# Patient Record
Sex: Male | Born: 1960 | Race: Black or African American | Hispanic: No | Marital: Married | Smoking: Former smoker
Health system: Southern US, Community
[De-identification: ages and names within clinical notes are randomized; demographics above are authoritative.]

## PROBLEM LIST (undated history)

## (undated) DIAGNOSIS — K219 Gastro-esophageal reflux disease without esophagitis: Secondary | ICD-10-CM

## (undated) DIAGNOSIS — M5416 Radiculopathy, lumbar region: Secondary | ICD-10-CM

## (undated) DIAGNOSIS — M549 Dorsalgia, unspecified: Secondary | ICD-10-CM

## (undated) DIAGNOSIS — H524 Presbyopia: Secondary | ICD-10-CM

## (undated) DIAGNOSIS — F329 Major depressive disorder, single episode, unspecified: Secondary | ICD-10-CM

## (undated) DIAGNOSIS — H5200 Hypermetropia, unspecified eye: Secondary | ICD-10-CM

## (undated) DIAGNOSIS — F419 Anxiety disorder, unspecified: Secondary | ICD-10-CM

## (undated) DIAGNOSIS — E119 Type 2 diabetes mellitus without complications: Secondary | ICD-10-CM

## (undated) DIAGNOSIS — I10 Essential (primary) hypertension: Secondary | ICD-10-CM

## (undated) DIAGNOSIS — E785 Hyperlipidemia, unspecified: Secondary | ICD-10-CM

## (undated) DIAGNOSIS — J309 Allergic rhinitis, unspecified: Secondary | ICD-10-CM

## (undated) DIAGNOSIS — M1990 Unspecified osteoarthritis, unspecified site: Secondary | ICD-10-CM

## (undated) DIAGNOSIS — F32A Depression, unspecified: Secondary | ICD-10-CM

## (undated) DIAGNOSIS — M5417 Radiculopathy, lumbosacral region: Secondary | ICD-10-CM

## (undated) DIAGNOSIS — Z8719 Personal history of other diseases of the digestive system: Secondary | ICD-10-CM

## (undated) HISTORY — PX: LUMBAR LAMINECTOMY: SHX95

## (undated) HISTORY — DX: Gastro-esophageal reflux disease without esophagitis: K21.9

## (undated) HISTORY — DX: Radiculopathy, lumbar region: M54.16

## (undated) HISTORY — DX: Essential (primary) hypertension: I10

## (undated) HISTORY — DX: Dorsalgia, unspecified: M54.9

## (undated) HISTORY — DX: Presbyopia: H52.4

## (undated) HISTORY — DX: Radiculopathy, lumbosacral region: M54.17

## (undated) HISTORY — DX: Allergic rhinitis, unspecified: J30.9

## (undated) HISTORY — PX: OTHER SURGICAL HISTORY: SHX169

## (undated) HISTORY — PX: BACK SURGERY: SHX140

## (undated) HISTORY — DX: Hypermetropia, unspecified eye: H52.00

## (undated) HISTORY — DX: Unspecified osteoarthritis, unspecified site: M19.90

## (undated) HISTORY — DX: Hyperlipidemia, unspecified: E78.5

---

## 1999-08-21 ENCOUNTER — Ambulatory Visit (HOSPITAL_COMMUNITY): Admission: RE | Admit: 1999-08-21 | Discharge: 1999-08-21 | Payer: Self-pay | Admitting: Cardiology

## 1999-08-21 ENCOUNTER — Encounter: Payer: Self-pay | Admitting: Cardiology

## 1999-09-03 ENCOUNTER — Ambulatory Visit (HOSPITAL_COMMUNITY): Admission: RE | Admit: 1999-09-03 | Discharge: 1999-09-03 | Payer: Self-pay | Admitting: Cardiology

## 2001-08-13 ENCOUNTER — Encounter: Payer: Self-pay | Admitting: Orthopedic Surgery

## 2001-08-13 ENCOUNTER — Encounter: Admission: RE | Admit: 2001-08-13 | Discharge: 2001-08-13 | Payer: Self-pay | Admitting: Orthopedic Surgery

## 2006-08-25 ENCOUNTER — Encounter (INDEPENDENT_AMBULATORY_CARE_PROVIDER_SITE_OTHER): Payer: Self-pay | Admitting: Specialist

## 2006-08-25 ENCOUNTER — Inpatient Hospital Stay (HOSPITAL_COMMUNITY): Admission: RE | Admit: 2006-08-25 | Discharge: 2006-08-27 | Payer: Self-pay | Admitting: Orthopedic Surgery

## 2006-08-25 HISTORY — PX: LAMINECTOMY: SHX219

## 2009-07-31 ENCOUNTER — Emergency Department (HOSPITAL_COMMUNITY): Admission: EM | Admit: 2009-07-31 | Discharge: 2009-07-31 | Payer: Self-pay | Admitting: Emergency Medicine

## 2011-01-23 LAB — COMPREHENSIVE METABOLIC PANEL
ALT: 18 U/L (ref 0–53)
BUN: 7 mg/dL (ref 6–23)
CO2: 28 mEq/L (ref 19–32)
Calcium: 9 mg/dL (ref 8.4–10.5)
GFR calc non Af Amer: >60 mL/min (ref >60)
Glucose, Bld: 100 mg/dL — ABNORMAL HIGH (ref 70–99)
Sodium: 139 mEq/L (ref 135–145)
Total Protein: 7.2 g/dL (ref 6.0–8.3)

## 2011-01-23 LAB — CBC
HCT: 43.7 % (ref 39.0–52.0)
Hemoglobin: 14.7 g/dL (ref 13.0–17.0)
MCHC: 33.7 g/dL (ref 30.0–36.0)
MCV: 88.6 fL (ref 78.0–100.0)
RBC: 4.93 MIL/uL (ref 4.22–5.81)
RDW: 13.1 % (ref 11.5–15.5)

## 2011-01-23 LAB — DIFFERENTIAL
Basophils Relative: 0 % (ref 0–1)
Eosinophils Absolute: 0.1 10*3/uL (ref 0.0–0.7)
Lymphs Abs: 2.6 10*3/uL (ref 0.7–4.0)
Neutro Abs: 3.4 10*3/uL (ref 1.7–7.7)
Neutrophils Relative %: 52 % (ref 43–77)

## 2011-01-23 LAB — POCT CARDIAC MARKERS
Myoglobin, poc: 84.8 ng/mL (ref 12–200)
Troponin i, poc: <0.05 ng/mL (ref 0.00–0.09)

## 2011-01-23 LAB — D-DIMER, QUANTITATIVE: D-Dimer, Quant: <0.22 ug/mL-FEU (ref 0.00–0.48)

## 2011-03-07 NOTE — H&P (Signed)
[name], [name] NO.:  [id number]   MEDICAL RECORD NO.:  [id number]          PATIENT TYPE:  INP   LOCATION:  NA                           FACILITY:  [facility]   PHYSICIAN:  Lianne Cure, P.A.  DATE OF BIRTH:  Nov 03, 1960   DATE OF ADMISSION:  DATE OF DISCHARGE:                                HISTORY & PHYSICAL   CHIEF COMPLAINT:  Lumbar pain with posterior left leg radiating pain.   HISTORY OF PRESENT ILLNESS:  Recurrence of herniated nucleus pulposus disk  L4-5 with radicular pain left lower extremity.   CURRENT MEDICATIONS:  1. Vicodin 7.5/750 twice daily.  2. Micardis 80 mg once daily.  3. Norvasc 10 mg once daily.  4. Paxil 10 mg once daily.  5. Prilosec 10 mg once daily.   PAST MEDICAL HISTORY:  1. Hypertension.  2. Lumbar herniated nucleus pulposus.   PAST SURGICAL HISTORY:  L4-5 disk diskectomy surgery in 1997.   FAMILY HISTORY:  CVA, hypertension, diabetes, OA, cancer.   REVIEW OF SYSTEMS:  He reports no fevers, chills.  No sudden weight loss. No  hoarseness.  No bleeding tendencies.  No shortness of breath.  No bowel or  bladder incontinence.   PHYSICAL EXAM:  VITAL SIGNS:  He has a temperature of 97.5, pulse of 91,  respirations of 15, and blood pressure 132/92.  GENERAL:  He appears well-developed, well-nourished.  Normocephalic.  HEENT:  Pupils are equal, round, and reactive to light.  NECK:  Supple.  No adenopathy.  No masses palpated.  Full range of motion.  CHEST:  Clear to auscultation bilaterally.  No wheezes.  HEART:  Regular rate and rhythm.  No murmurs.  ABDOMEN:  Soft, positive bowel sounds auscultated.  Nontender to palpation.  EXTREMITIES:  He reports left posterior leg radicular pain with straight leg  raise and on and off throughout the day, depending on positioning.  He is  currently in a seated position with the majority of his weight resting on  the right buttock area.  SKIN:  Clean, dry, and intact.   MRI shows  degenerative disk at L5, S1 and recurrent herniation at L4-5.   PLAN:  Laminotomy disk effusion L4-5 by Dr. Nelda Stewart.      Lianne Cure, P.A.     MC/MEDQ  D:  08/21/2006  T:  08/22/2006  Job:  [id number]

## 2011-03-07 NOTE — Op Note (Signed)
NAMECORRIN, HINGLE              ACCOUNT NO.:  [id number]   MEDICAL RECORD NO.:  [id number]          PATIENT TYPE:  AMB   LOCATION:  SDS                          FACILITY:  [facility]   PHYSICIAN:  Nelda Severe, MD      DATE OF BIRTH:  [birth year]   DATE OF PROCEDURE:  08/25/2006  DATE OF DISCHARGE:                                 OPERATIVE REPORT   SURGEON:  Dr. Nelda Severe.   ASSISTANT:  Lianne Cure, PA-C   PREOPERATIVE DIAGNOSIS:  Recurrent L4-5 disk herniation.   POSTOPERATIVE DIAGNOSIS:  Recurrent L4-5 disk herniation.   OPERATIVE PROCEDURE:  L4-5 re-exploration, laminectomy and disk excision,  left side.   INDICATIONS FOR SURGERY:  Severe disabling left sciatic pain.   OPERATIVE NOTE:  The patient was brought to the operating room and placed  under general endotracheal anesthesia.  Previously been given a gram of  vancomycin intravenously.  Bilateral sequential compression devices were  placed on both lower extremities.  Foley catheter was placed in the bladder.   He is positioned prone on the [place] frame.  Care was taken to position him  so as to avoid hyperflexion abduction of his left shoulder and hyperflexion  the left elbow.  The upper extremity was padded from axilla to hand with  foam.  Because of a mechanical failure of the arm support on the patient's  right side at the time that we had positioned him, his right arm was padded  well and placed at his side and the arm held in position with tape placed  over the foam padding.   The lumbar area was clipped of hair.  The previous midline incision was  outlined skin marker.  The lumbar area was prepped with DuraPrep and draped  in square fashion.  The drapes were secured with Ioban.   The skin was scored in elliptical fashion so as to excise the previous  hypertrophic scar.  The subcutaneous tissue and dermis were infiltrated with  quarter percent Marcaine with epinephrine 1% Marcaine with epinephrine mixed  1:1.  The elliptical incision was then completed into the subcutaneous layer  and the previous scar so excised.   Dissection was carried down to the spinous processes of the distal lumbar  spine.  Paraspinal muscles were reflected to the left side.  Cross-table  lateral radiograph was taken with a Kocher on what proved to be L3.   We next then exposed what we thought to be the L4-5 interspace and performed  a laminotomy.  However, there this area appeared virginal, no scar and the  disk was not herniated.  Cross-table lateral radiograph was taken with a  Penfield 4 in the disk space which I used to perforate the annulus.  This  proved to be L3-4 disk.   Therefore, we moved one space distally.  The inferior facette, left L5 was  extremely overgrown.  The medial facetectomy was performed with an  osteotome.  The laminectomy was extended proximally with osteotomes and  Kerrison rongeurs.  Ligamentum flavum was detached using curettes and  removed from proximal to distal.  It  was possible to see the origin of the  L5 nerve root, prior to their disappearing into scar.  We retracted we could  see the L5 nerve root medially and then incised the L4-5 disk laterally so  that it would Epstein curette and then pituitary rongeurs.  Ultimately we  placed a large lamina spreader between the spinous process of L4 and L5 so  as to open up the disk space better.  What followed was fairly arduous task  of curetting out the disk little by little mobilizing the nerve root away  from the scar.  Ultimately a thorough diskectomy was performed and several  moderately large fragments of sequestrated disk fragments were removed,  mostly distally and somewhat centrally.  At the completion of this  diskectomy the L5 nerve root was quite mobile and appeared to be not under  any significant pressure or tension.   The disk space was repeatedly irrigated and small fragments of additional  fragments sucked out.  There  was some mild oozing.  A Blake drain was placed  in the depths of the wound in the laminectomy defect and brought out through  the skin to the left side.  The Blake drain was secured with the basket  weave type of 2-0 nylon suture.  The fascia was then closed using  interrupted figure-of-eight #1 Vicryl sutures.  The subcutaneous tissue was  closed using interrupted 2-0 Vicryl sutures and the skin was closed using  undyed 3-0 Vicryl in subcuticular fashion.  The skin edges were reinforced  with Steri-Strips.  A nonadherent antibiotic ointment dressing was applied  and secured with OpSite.   The blood loss estimated at 150 mL.  There were no intraoperative  complications other than misidentification of the level which was corrected  and the correct level documented with a cross-table lateral radiograph.  Sponge, needle counts were correct.   The patient has not awakened sufficiently perform neurologic examination at  this time so none is reported in this note.      Nelda Severe, MD  Electronically Signed     MT/MEDQ  D:  08/25/2006  T:  08/26/2006  Job:  [id number]

## 2011-03-07 NOTE — Discharge Summary (Signed)
NAMEKYM, FENTER NO.:  [id number]   MEDICAL RECORD NO.:  [id number]          PATIENT TYPE:  INP   LOCATION:  5012                         FACILITY:  [facility]   PHYSICIAN:  Nelda Severe, MD      DATE OF BIRTH:  08-28-1961   DATE OF ADMISSION:  08/25/2006  DATE OF DISCHARGE:  08/27/2006                                 DISCHARGE SUMMARY   ADMITTING DIAGNOSES:  1. Lumbar herniated nucleus pulposus at L4-5.  2. Hypertension.   DISCHARGE DIAGNOSES:  1. Status post discectomy, laminotomy L4-5.  2. Hypertension.   BRIEF HISTORY OF ADMISSION COURSE:  Patient was taken to the OR by Dr.  Nelda Severe on August 25, 2006 for discectomy at L4-5, laminectomy, disc  excision.  Patient was stable in recovery postoperatively.  He was placed on  his home medications and he was given Norco 10/325 one to two q.6. p.r.n.  for pain control.  He was placed in SCDs and knee-high TED hose.  Physical  therapy was ordered.  Drain was then placed, Hemovac.  He was given morphine  one q.2-4.h. p.r.n. for breakthrough pain IV until he could take POs, as  well Vancomycin was administered x1 dose preoperatively and x2 doses  postoperatively, dose per pharmacy.  Postop day one, the patient had  ambulated with physical therapy, had significant muscle spasms so was kept  for observation.  Postop day two, patient tolerating POs well, ambulating  independently with rolling walker.  The Hemovac was discharged on day one,  very minimal drainage from the Hemovac site.  The incision site itself was  clean, dry and intact.  Distally, he is neurovascular and motor intact.  His  left sciatic pain has diminished significantly postoperatively.  Diet has  been advanced to a regular diet as tolerated.   ASSESSMENT:  Status post discectomy, laminotomy L4-5.   PLAN:  We are going to discharge him home in the care of his family.  He has  a walker.  He is going to walk for exercise.  He may shower on day  three.  He is going to follow up with Dr. Nelda Severe in the office four weeks  from now, he will call for an appointment.  He has Flexeril at home to take  p.r.n. for muscle spasms, 10 mg q.6. and once drainage has stopped, no  dressing is necessary, no brace is necessary.  His condition is stable.  Diet is regular.      Lianne Cure, P.A.      Nelda Severe, MD  Electronically Signed    MC/MEDQ  D:  08/27/2006  T:  08/27/2006  Job:  [id number]

## 2011-10-22 ENCOUNTER — Other Ambulatory Visit: Payer: Self-pay | Admitting: Cardiology

## 2011-11-12 HISTORY — PX: HEMORROIDECTOMY: SUR656

## 2013-05-02 DIAGNOSIS — M5417 Radiculopathy, lumbosacral region: Secondary | ICD-10-CM

## 2013-05-02 HISTORY — DX: Radiculopathy, lumbosacral region: M54.17

## 2014-08-04 DIAGNOSIS — M549 Dorsalgia, unspecified: Secondary | ICD-10-CM

## 2014-08-04 HISTORY — DX: Dorsalgia, unspecified: M54.9

## 2015-03-12 DIAGNOSIS — M5416 Radiculopathy, lumbar region: Secondary | ICD-10-CM

## 2015-03-12 HISTORY — DX: Radiculopathy, lumbar region: M54.16

## 2015-06-28 ENCOUNTER — Other Ambulatory Visit: Payer: Self-pay | Admitting: Orthopedic Surgery

## 2015-06-29 ENCOUNTER — Encounter: Payer: Self-pay | Admitting: Vascular Surgery

## 2015-07-02 ENCOUNTER — Encounter: Payer: Self-pay | Admitting: Vascular Surgery

## 2015-07-16 ENCOUNTER — Encounter: Payer: Self-pay | Admitting: Vascular Surgery

## 2015-07-17 ENCOUNTER — Ambulatory Visit (INDEPENDENT_AMBULATORY_CARE_PROVIDER_SITE_OTHER): Payer: Commercial Managed Care - HMO | Admitting: Vascular Surgery

## 2015-07-17 ENCOUNTER — Encounter: Payer: Self-pay | Admitting: Vascular Surgery

## 2015-07-17 VITALS — BP 114/71 | HR 76 | Temp 97.2°F | Resp 18 | Ht 70.0 in | Wt 256.7 lb

## 2015-07-17 DIAGNOSIS — M5137 Other intervertebral disc degeneration, lumbosacral region: Secondary | ICD-10-CM | POA: Diagnosis not present

## 2015-07-17 NOTE — Progress Notes (Signed)
Patient name: [name] MRN: [id number] DOB: 02/19/1961 Sex: male   Referred by: Yevette Edwards  Reason for referral:  Chief Complaint  Patient presents with  . New Evaluation    ALIF scheduled for 08-01-2015    HISTORY OF PRESENT ILLNESS:  patient resents today for discussion of anterior exposure for L4-5 and L5-S1 surgery. His long history of degenerative disc disease and has had for several prior lumbar back surgeries. He has no history of prior intra-abdominal surgery. Has no history of cardiac disease and no history of peripheral vascular occlusive disease  Past Medical History  Diagnosis Date  . Hypertension   . Hyperlipidemia   . Osteoarthritis   . Gastroesophageal reflux disease   . DJD (degenerative joint disease)   . Lumbar radiculopathy   . Hypermetropia   . Presbyopia   . Allergic rhinitis   . Lumbar radiculopathy 03-12-2015  . Back pain with radiation 08-04-2014  . Lumbosacral neuritis 05-02-2013    Past Surgical History  Procedure Laterality Date  . Hemorroidectomy  11/12/2011  . Epidural injections    . Lumbar laminectomy    . Laminectomy  08/25/2006    Social History   Social History  . Marital Status: Single    Spouse Name: N/A  . Number of Children: N/A  . Years of Education: N/A   Occupational History  . Not on file.   Social History Main Topics  . Smoking status: Former Smoker    Types: Cigarettes    Quit date: 03/20/2009  . Smokeless tobacco: Not on file  . Alcohol Use: 1.2 - 1.8 oz/week    0 Standard drinks or equivalent, 2-3 Glasses of wine per week  . Drug Use: No  . Sexual Activity: Not on file   Other Topics Concern  . Not on file   Social History Narrative    Family History  Problem Relation Age of Onset  . Hypertension Mother   . Hypertension Father     Allergies as of 07/17/2015  . (No Known Allergies)    Current Outpatient Prescriptions on File Prior to Visit  Medication Sig Dispense Refill  .  dexlansoprazole (DEXILANT) 60 MG capsule Take 60 mg by mouth daily.    Marland Kitchen LISINOPRIL PO Take 20 mg by mouth daily.    . Olmesartan-Amlodipine-HCTZ (TRIBENZOR) 40-10-25 MG TABS Take by mouth.    Marland Kitchen PARoxetine (PAXIL) 10 MG tablet Take 10 mg by mouth daily.    Marland Kitchen SPIRONOLACTONE PO Take 25 mg by mouth.    . TRAMADOL HCL PO Take 50 mg by mouth.    . Vardenafil HCl (LEVITRA PO) Take 20 mg by mouth.     No current facility-administered medications on file prior to visit.     REVIEW OF SYSTEMS:  Positives indicated with an "X"  CARDIOVASCULAR:   chest pain    chest pressure    palpitations    orthopnea    dyspnea on exertion    claudication    rest pain    DVT    phlebitis PULMONARY:    productive cough    asthma    wheezing NEUROLOGIC:   [ x] weakness  [x ] paresthesias   aphasia   amaurosis   dizziness HEMATOLOGIC:    bleeding problems    clotting disorders MUSCULOSKELETAL:   joint pain    joint swelling GASTROINTESTINAL:   blood in stool    hematemesis GENITOURINARY:    dysuria    hematuria PSYCHIATRIC:   history of major depression INTEGUMENTARY:   rashes   ulcers CONSTITUTIONAL:   fever    chills  PHYSICAL EXAMINATION:  General: The patient is a well-nourished male, in no acute distress. Vital signs are BP 114/71 mmHg  Pulse 76  Temp(Src) 97.2 F (36.2 C) (Oral)  Resp 18  Ht  (1.778 m)  Wt 256 lb 11.2 oz (116.438 kg)  BMI 36.83 kg/m2  SpO2 96% Pulmonary: There is a good air exchange  Abdomen: Soft and non-tender  With no palpable masses palpable. Moderate obesity Musculoskeletal: There are no major deformities.  There is no significant extremity pain. Neurologic: No focal weakness or paresthesias are detected, Skin: There are no ulcer or rashes noted. Psychiatric: The patient has normal affect. Cardiovascular:  Palpable dorsalis pedis pulses bilatrally    Impression and Plan:   I  discussed my role for exposure. Explain the incision related to 2 level exposure which is a left paramedian. Explain mobilization of intraperitoneal contents, left ureter, iliac arteries and veins. Discussed potential injury of all these. Does not have any issues that would the indicating increased risk for plannd procedure. He understands and wished to proceed as scheduled on 08/01/2015    Gretta Began [facility] [place] [facility] Office: [phone]

## 2015-07-19 ENCOUNTER — Other Ambulatory Visit: Payer: Self-pay

## 2015-07-24 ENCOUNTER — Other Ambulatory Visit (HOSPITAL_COMMUNITY): Payer: Self-pay | Admitting: *Deleted

## 2015-07-24 ENCOUNTER — Encounter (HOSPITAL_COMMUNITY): Payer: Self-pay

## 2015-07-24 ENCOUNTER — Encounter (HOSPITAL_COMMUNITY)
Admission: RE | Admit: 2015-07-24 | Discharge: 2015-07-24 | Disposition: A | Discharge status: Home / Self Care | Payer: Commercial Managed Care - HMO | Source: Ambulatory Visit | Attending: Orthopedic Surgery | Admitting: Orthopedic Surgery

## 2015-07-24 ENCOUNTER — Ambulatory Visit (HOSPITAL_COMMUNITY)
Admission: RE | Admit: 2015-07-24 | Discharge: 2015-07-24 | Disposition: A | Discharge status: Home / Self Care | Payer: Commercial Managed Care - HMO | Source: Ambulatory Visit | Attending: Orthopedic Surgery | Admitting: Orthopedic Surgery

## 2015-07-24 DIAGNOSIS — Z01812 Encounter for preprocedural laboratory examination: Secondary | ICD-10-CM | POA: Diagnosis not present

## 2015-07-24 DIAGNOSIS — Z01818 Encounter for other preprocedural examination: Secondary | ICD-10-CM | POA: Diagnosis not present

## 2015-07-24 DIAGNOSIS — Z01810 Encounter for preprocedural cardiovascular examination: Secondary | ICD-10-CM | POA: Insufficient documentation

## 2015-07-24 DIAGNOSIS — I10 Essential (primary) hypertension: Secondary | ICD-10-CM | POA: Diagnosis not present

## 2015-07-24 DIAGNOSIS — Z87891 Personal history of nicotine dependence: Secondary | ICD-10-CM | POA: Insufficient documentation

## 2015-07-24 HISTORY — DX: Depression, unspecified: F32.A

## 2015-07-24 HISTORY — DX: Personal history of other diseases of the digestive system: Z87.19

## 2015-07-24 HISTORY — DX: Anxiety disorder, unspecified: F41.9

## 2015-07-24 HISTORY — DX: Major depressive disorder, single episode, unspecified: F32.9

## 2015-07-24 LAB — SURGICAL PCR SCREEN
MRSA, PCR: NEGATIVE
STAPHYLOCOCCUS AUREUS: NEGATIVE

## 2015-07-24 LAB — CBC WITH DIFFERENTIAL/PLATELET
BASOS ABS: 0.1 10*3/uL (ref 0.0–0.1)
Basophils Relative: 1 %
EOS PCT: 1 %
Eosinophils Absolute: 0.1 10*3/uL (ref 0.0–0.7)
HCT: 41.7 % (ref 39.0–52.0)
HEMOGLOBIN: 13.9 g/dL (ref 13.0–17.0)
LYMPHS ABS: 2.5 10*3/uL (ref 0.7–4.0)
LYMPHS PCT: 31 %
MCH: 28.7 pg (ref 26.0–34.0)
MCHC: 33.3 g/dL (ref 30.0–36.0)
MCV: 86 fL (ref 78.0–100.0)
Monocytes Absolute: 0.3 10*3/uL (ref 0.1–1.0)
Monocytes Relative: 4 %
NEUTROS ABS: 5 10*3/uL (ref 1.7–7.7)
NEUTROS PCT: 63 %
Platelets: 207 10*3/uL (ref 150–400)
RBC: 4.85 MIL/uL (ref 4.22–5.81)
RDW: 13.2 % (ref 11.5–15.5)
WBC: 8 10*3/uL (ref 4.0–10.5)

## 2015-07-24 LAB — URINALYSIS, ROUTINE W REFLEX MICROSCOPIC
Bilirubin Urine: NEGATIVE
Glucose, UA: NEGATIVE mg/dL
Hgb urine dipstick: NEGATIVE
KETONES UR: NEGATIVE mg/dL
LEUKOCYTES UA: NEGATIVE
NITRITE: NEGATIVE
PROTEIN: NEGATIVE mg/dL
Specific Gravity, Urine: 1.017 (ref 1.005–1.030)
Urobilinogen, UA: 0.2 mg/dL (ref 0.0–1.0)
pH: 5 (ref 5.0–8.0)

## 2015-07-24 LAB — COMPREHENSIVE METABOLIC PANEL
ALK PHOS: 65 U/L (ref 38–126)
ALT: 23 U/L (ref 17–63)
AST: 30 U/L (ref 15–41)
Albumin: 4 g/dL (ref 3.5–5.0)
Anion gap: 11 (ref 5–15)
BUN: 16 mg/dL (ref 6–20)
CALCIUM: 9.5 mg/dL (ref 8.9–10.3)
CHLORIDE: 101 mmol/L (ref 101–111)
CO2: 26 mmol/L (ref 22–32)
CREATININE: 1.36 mg/dL — AB (ref 0.61–1.24)
GFR, EST NON AFRICAN AMERICAN: 58 mL/min — AB (ref >60)
Glucose, Bld: 148 mg/dL — ABNORMAL HIGH (ref 65–99)
Potassium: 4 mmol/L (ref 3.5–5.1)
Sodium: 138 mmol/L (ref 135–145)
Total Bilirubin: 0.6 mg/dL (ref 0.3–1.2)
Total Protein: 7.3 g/dL (ref 6.5–8.1)

## 2015-07-24 LAB — TYPE AND SCREEN
ABO/RH(D): B POS
Antibody Screen: NEGATIVE

## 2015-07-24 LAB — PROTIME-INR
INR: 1.19 (ref 0.00–1.49)
PROTHROMBIN TIME: 15.2 s (ref 11.6–15.2)

## 2015-07-24 LAB — APTT: APTT: 27 s (ref 24–37)

## 2015-07-24 NOTE — Progress Notes (Signed)
   07/24/15 1047  OBSTRUCTIVE SLEEP APNEA  Have you ever been diagnosed with sleep apnea through a sleep study? No  Do you snore loudly (loud enough to be heard through closed doors)?  0  Do you often feel tired, fatigued, or sleepy during the daytime (such as falling asleep during driving or talking to someone)? 1  Has anyone observed you stop breathing during your sleep? 0  Do you have, or are you being treated for high blood pressure? 1  BMI more than 35 kg/m2? 1  Age > 50 (1-yes) 1  Neck circumference greater than:Male 16 inches or larger, Male 17inches or larger? 1  Male Gender (Yes=1) 1  Obstructive Sleep Apnea Score 6  Score 5 or greater  Results sent to PCP

## 2015-07-24 NOTE — Pre-Procedure Instructions (Signed)
KIER SMEAD  07/24/2015      [id number] [place], [place] - [address] [place] [address] Phone: [phone] Fax: [phone]    Your procedure is scheduled on 08/01/2015  Report to [facility] Admitting at 6:30A.M.  Call this number if you have problems the morning of surgery:  [phone]   Remember:  Do not eat food or drink liquids after midnight.  On TUESDAY  Take these medicines the morning of surgery with A SIP OF WATER : take Paxil, Metoprolol, Dexilant   Do not wear jewelry  Do not wear lotions, powders, or perfumes.  You may wear deodorant.             Men may shave face and neck.  Do not bring valuables to the hospital.  [facility] is not responsible for any belongings or valuables.  Contacts, dentures or bridgework may not be worn into surgery.  Leave your suitcase in the car.  After surgery it may be brought to your room.  For patients admitted to the hospital, discharge time will be determined by your treatment team.  Patients discharged the day of surgery will not be allowed to drive home.   Name and phone number of your driver:   /with family Special instructions:  Special Instructions: [place] - Preparing for Surgery  Before surgery, you can play an important role.  Because skin is not sterile, your skin needs to be as free of germs as possible.  You can reduce the number of germs on you skin by washing with CHG (chlorahexidine gluconate) soap before surgery.  CHG is an antiseptic cleaner which kills germs and bonds with the skin to continue killing germs even after washing.  Please DO NOT use if you have an allergy to CHG or antibacterial soaps.  If your skin becomes reddened/irritated stop using the CHG and inform your nurse when you arrive at Short Stay.  Do not shave (including legs and underarms) for at least 48 hours prior to the first CHG shower.  You may shave your  face.  Please follow these instructions carefully:   1.  Shower with CHG Soap the night before surgery and the  morning of Surgery.  2.  If you choose to wash your hair, wash your hair first as usual with your  normal shampoo.  3.  After you shampoo, rinse your hair and body thoroughly to remove the  Shampoo.  4.  Use CHG as you would any other liquid soap.  You can apply chg directly to the skin and wash gently with scrungie or a clean washcloth.  5.  Apply the CHG Soap to your body ONLY FROM THE NECK DOWN.    Do not use on open wounds or open sores.  Avoid contact with your eyes, ears, mouth and genitals (private parts).  Wash genitals (private parts)   with your normal soap.  6.  Wash thoroughly, paying special attention to the area where your surgery will be performed.  7.  Thoroughly rinse your body with warm water from the neck down.  8.  DO NOT shower/wash with your normal soap after using and rinsing off   the CHG Soap.  9.  Pat yourself dry with a clean towel.            10.  Wear clean pajamas.            11.  Place clean  sheets on your bed the night of your first shower and do not sleep with pets.  Day of Surgery  Do not apply any lotions/deodorants the morning of surgery.  Please wear clean clothes to the hospital/surgery center.  Please read over the following fact sheets that you were given. Pain Booklet, Coughing and Deep Breathing, Blood Transfusion Information, MRSA Information and Surgical Site Infection Prevention

## 2015-07-24 NOTE — Progress Notes (Signed)
Via fax, requested note from Dr. Sharyn Lull & last ekg.

## 2015-07-31 MED ORDER — POVIDONE-IODINE 7.5 % EX SOLN
Freq: Once | CUTANEOUS | Status: DC
  Filled 2015-07-31: qty 118

## 2015-07-31 MED ORDER — CEFAZOLIN SODIUM-DEXTROSE 2-3 GM-% IV SOLR
2.0000 g | INTRAVENOUS | Status: AC
  Administered 2015-08-01 (×3): 2 g via INTRAVENOUS
  Filled 2015-07-31: qty 50

## 2015-07-31 NOTE — Anesthesia Preprocedure Evaluation (Addendum)
Anesthesia Evaluation  Patient identified by MRN, date of birth, ID band Patient awake    Reviewed: Allergy & Precautions, NPO status , Patient's Chart, lab work & pertinent test results  History of Anesthesia Complications Negative for: history of anesthetic complications  Airway Mallampati: II  TM Distance: >3 FB Neck ROM: Full    Dental  (+) Teeth Intact, Dental Advisory Given   Pulmonary former smoker,    Pulmonary exam normal        Cardiovascular hypertension, Pt. on medications and Pt. on home beta blockers Normal cardiovascular exam     Neuro/Psych PSYCHIATRIC DISORDERS Anxiety Depression    GI/Hepatic Neg liver ROS, hiatal hernia, GERD  Medicated and Controlled,  Endo/Other  Morbid obesity  Renal/GU negative Renal ROS     Musculoskeletal  (+) Arthritis , Osteoarthritis,    Abdominal   Peds  Hematology   Anesthesia Other Findings   Reproductive/Obstetrics                          Anesthesia Physical Anesthesia Plan  ASA: III  Anesthesia Plan: General   Post-op Pain Management:    Induction: Intravenous  Airway Management Planned: Oral ETT  Additional Equipment:   Intra-op Plan:   Post-operative Plan: Extubation in OR  Informed Consent: I have reviewed the patients History and Physical, chart, labs and discussed the procedure including the risks, benefits and alternatives for the proposed anesthesia with the patient or authorized representative who has indicated his/her understanding and acceptance.   Dental advisory given  Plan Discussed with: CRNA, Anesthesiologist and Surgeon  Anesthesia Plan Comments:       Anesthesia Quick Evaluation

## 2015-08-01 ENCOUNTER — Inpatient Hospital Stay (HOSPITAL_COMMUNITY): Payer: Commercial Managed Care - HMO | Admitting: Anesthesiology

## 2015-08-01 ENCOUNTER — Inpatient Hospital Stay (HOSPITAL_COMMUNITY)
Admission: RE | Admit: 2015-08-01 | Discharge: 2015-08-04 | DRG: 455 | Disposition: A | Discharge status: Home Health | Payer: Commercial Managed Care - HMO | Source: Ambulatory Visit | Attending: Orthopedic Surgery | Admitting: Orthopedic Surgery

## 2015-08-01 ENCOUNTER — Inpatient Hospital Stay (HOSPITAL_COMMUNITY): Payer: Commercial Managed Care - HMO

## 2015-08-01 ENCOUNTER — Encounter (HOSPITAL_COMMUNITY): Payer: Self-pay | Admitting: *Deleted

## 2015-08-01 ENCOUNTER — Encounter (HOSPITAL_COMMUNITY)
Admission: RE | Disposition: A | Discharge status: Home Health | Payer: Self-pay | Source: Ambulatory Visit | Attending: Orthopedic Surgery

## 2015-08-01 DIAGNOSIS — E785 Hyperlipidemia, unspecified: Secondary | ICD-10-CM | POA: Diagnosis present

## 2015-08-01 DIAGNOSIS — M5410 Radiculopathy, site unspecified: Secondary | ICD-10-CM | POA: Diagnosis present

## 2015-08-01 DIAGNOSIS — K219 Gastro-esophageal reflux disease without esophagitis: Secondary | ICD-10-CM | POA: Diagnosis present

## 2015-08-01 DIAGNOSIS — M5136 Other intervertebral disc degeneration, lumbar region: Secondary | ICD-10-CM

## 2015-08-01 DIAGNOSIS — Z87891 Personal history of nicotine dependence: Secondary | ICD-10-CM

## 2015-08-01 DIAGNOSIS — M4806 Spinal stenosis, lumbar region: Secondary | ICD-10-CM | POA: Diagnosis present

## 2015-08-01 DIAGNOSIS — F329 Major depressive disorder, single episode, unspecified: Secondary | ICD-10-CM | POA: Diagnosis present

## 2015-08-01 DIAGNOSIS — M5137 Other intervertebral disc degeneration, lumbosacral region: Principal | ICD-10-CM | POA: Diagnosis present

## 2015-08-01 DIAGNOSIS — Z419 Encounter for procedure for purposes other than remedying health state, unspecified: Secondary | ICD-10-CM

## 2015-08-01 DIAGNOSIS — I10 Essential (primary) hypertension: Secondary | ICD-10-CM | POA: Diagnosis present

## 2015-08-01 HISTORY — PX: ANTERIOR LUMBAR FUSION: SHX1170

## 2015-08-01 HISTORY — PX: ABDOMINAL EXPOSURE: SHX5708

## 2015-08-01 SURGERY — ANTERIOR LUMBAR FUSION 2 LEVELS
Anesthesia: General | Site: Spine Lumbar

## 2015-08-01 MED ORDER — CALCIUM CHLORIDE 10 % IV SOLN
INTRAVENOUS | Status: DC | PRN
  Administered 2015-08-01: 100 mg via INTRAVENOUS

## 2015-08-01 MED ORDER — AMLODIPINE BESYLATE 5 MG PO TABS
5.0000 mg | ORAL_TABLET | Freq: Every day | ORAL | Status: DC
  Administered 2015-08-02 – 2015-08-03 (×2): 5 mg via ORAL
  Filled 2015-08-01 (×3): qty 1

## 2015-08-01 MED ORDER — PROPOFOL 10 MG/ML IV BOLUS
INTRAVENOUS | Status: AC
  Filled 2015-08-01: qty 20

## 2015-08-01 MED ORDER — BUPIVACAINE-EPINEPHRINE 0.25% -1:200000 IJ SOLN
INTRAMUSCULAR | Status: DC | PRN
  Administered 2015-08-01: 17 mL

## 2015-08-01 MED ORDER — SPIRONOLACTONE 25 MG PO TABS
25.0000 mg | ORAL_TABLET | Freq: Every day | ORAL | Status: DC
  Administered 2015-08-02 – 2015-08-04 (×3): 25 mg via ORAL
  Filled 2015-08-01 (×3): qty 1

## 2015-08-01 MED ORDER — ACETAMINOPHEN 650 MG RE SUPP: 650.0000 mg | RECTAL | Status: DC | PRN

## 2015-08-01 MED ORDER — LIDOCAINE HCL (CARDIAC) 20 MG/ML IV SOLN
INTRAVENOUS | Status: AC
  Filled 2015-08-01: qty 5

## 2015-08-01 MED ORDER — BUPIVACAINE-EPINEPHRINE 0.25% -1:200000 IJ SOLN
INTRAMUSCULAR | Status: DC | PRN
  Administered 2015-08-01: 10 mL

## 2015-08-01 MED ORDER — FENTANYL CITRATE (PF) 250 MCG/5ML IJ SOLN
INTRAMUSCULAR | Status: AC
  Filled 2015-08-01: qty 5

## 2015-08-01 MED ORDER — METHYLENE BLUE 1 % INJ SOLN
1.0000 mg | INTRAMUSCULAR | Status: DC | PRN
  Administered 2015-08-01: 1 mg via INTRAVENOUS

## 2015-08-01 MED ORDER — SODIUM CHLORIDE 0.9 % IJ SOLN
INTRAMUSCULAR | Status: AC
  Filled 2015-08-01: qty 10

## 2015-08-01 MED ORDER — EPHEDRINE SULFATE 50 MG/ML IJ SOLN
INTRAMUSCULAR | Status: AC
  Filled 2015-08-01: qty 1

## 2015-08-01 MED ORDER — PAROXETINE HCL 20 MG PO TABS
10.0000 mg | ORAL_TABLET | Freq: Every day | ORAL | Status: DC
  Administered 2015-08-02 – 2015-08-04 (×3): 10 mg via ORAL
  Filled 2015-08-01 (×3): qty 1

## 2015-08-01 MED ORDER — PROPOFOL 10 MG/ML IV BOLUS
INTRAVENOUS | Status: DC | PRN
  Administered 2015-08-01 (×2): 50 mg via INTRAVENOUS
  Administered 2015-08-01: 150 mg via INTRAVENOUS

## 2015-08-01 MED ORDER — PHENOL 1.4 % MT LIQD
1.0000 | OROMUCOSAL | Status: DC | PRN
Start: 2015-08-01 — End: 2015-08-04

## 2015-08-01 MED ORDER — LACTATED RINGERS IV SOLN
INTRAVENOUS | Status: DC | PRN
  Administered 2015-08-01 (×5): via INTRAVENOUS

## 2015-08-01 MED ORDER — CHLORHEXIDINE GLUCONATE 4 % EX LIQD: 60.0000 mL | Freq: Once | CUTANEOUS | Status: DC

## 2015-08-01 MED ORDER — HYDROCHLOROTHIAZIDE 25 MG PO TABS
25.0000 mg | ORAL_TABLET | Freq: Every day | ORAL | Status: DC
  Administered 2015-08-02 – 2015-08-04 (×3): 25 mg via ORAL
  Filled 2015-08-01 (×3): qty 1

## 2015-08-01 MED ORDER — HYDROMORPHONE HCL 1 MG/ML IJ SOLN
0.2500 mg | INTRAMUSCULAR | Status: DC | PRN
  Administered 2015-08-01 (×2): 0.5 mg via INTRAVENOUS

## 2015-08-01 MED ORDER — IRBESARTAN 300 MG PO TABS
300.0000 mg | ORAL_TABLET | Freq: Every day | ORAL | Status: DC
  Administered 2015-08-02 – 2015-08-03 (×2): 300 mg via ORAL
  Filled 2015-08-01 (×3): qty 1

## 2015-08-01 MED ORDER — DIAZEPAM 5 MG PO TABS
ORAL_TABLET | ORAL | Status: AC
  Administered 2015-08-01: 5 mg via ORAL
  Filled 2015-08-01: qty 1

## 2015-08-01 MED ORDER — FENTANYL CITRATE (PF) 100 MCG/2ML IJ SOLN
INTRAMUSCULAR | Status: DC | PRN
  Administered 2015-08-01: 25 ug via INTRAVENOUS
  Administered 2015-08-01: 50 ug via INTRAVENOUS
  Administered 2015-08-01: 25 ug via INTRAVENOUS
  Administered 2015-08-01: 100 ug via INTRAVENOUS
  Administered 2015-08-01: 25 ug via INTRAVENOUS
  Administered 2015-08-01: 100 ug via INTRAVENOUS
  Administered 2015-08-01: 125 ug via INTRAVENOUS
  Administered 2015-08-01 (×2): 50 ug via INTRAVENOUS
  Administered 2015-08-01 (×2): 25 ug via INTRAVENOUS
  Administered 2015-08-01: 150 ug via INTRAVENOUS
  Administered 2015-08-01: 50 ug via INTRAVENOUS

## 2015-08-01 MED ORDER — PHENYLEPHRINE HCL 10 MG/ML IJ SOLN
10.0000 mg | INTRAVENOUS | Status: DC | PRN
  Administered 2015-08-01: 40 ug/min via INTRAVENOUS

## 2015-08-01 MED ORDER — LISINOPRIL 20 MG PO TABS
20.0000 mg | ORAL_TABLET | Freq: Every day | ORAL | Status: DC
  Administered 2015-08-02 – 2015-08-03 (×2): 20 mg via ORAL
  Filled 2015-08-01 (×3): qty 1

## 2015-08-01 MED ORDER — PROMETHAZINE HCL 25 MG/ML IJ SOLN: 6.2500 mg | INTRAMUSCULAR | Status: DC | PRN

## 2015-08-01 MED ORDER — DEXAMETHASONE SODIUM PHOSPHATE 10 MG/ML IJ SOLN
INTRAMUSCULAR | Status: DC | PRN
  Administered 2015-08-01: 10 mg via INTRAVENOUS

## 2015-08-01 MED ORDER — ONDANSETRON HCL 4 MG/2ML IJ SOLN: 4.0000 mg | INTRAMUSCULAR | Status: DC | PRN

## 2015-08-01 MED ORDER — OXYCODONE-ACETAMINOPHEN 5-325 MG PO TABS
1.0000 | ORAL_TABLET | ORAL | Status: DC | PRN
  Administered 2015-08-01 – 2015-08-03 (×9): 2 via ORAL
  Filled 2015-08-01 (×9): qty 2

## 2015-08-01 MED ORDER — ROCURONIUM BROMIDE 100 MG/10ML IV SOLN
INTRAVENOUS | Status: DC | PRN
  Administered 2015-08-01: 10 mg via INTRAVENOUS
  Administered 2015-08-01: 50 mg via INTRAVENOUS
  Administered 2015-08-01: 10 mg via INTRAVENOUS
  Administered 2015-08-01: 20 mg via INTRAVENOUS
  Administered 2015-08-01: 10 mg via INTRAVENOUS

## 2015-08-01 MED ORDER — CEFAZOLIN SODIUM 1-5 GM-% IV SOLN
1.0000 g | Freq: Three times a day (TID) | INTRAVENOUS | Status: AC
  Administered 2015-08-01 – 2015-08-02 (×2): 1 g via INTRAVENOUS
  Filled 2015-08-01 (×2): qty 50

## 2015-08-01 MED ORDER — SCOPOLAMINE 1 MG/3DAYS TD PT72
1.0000 | MEDICATED_PATCH | TRANSDERMAL | Status: DC
  Administered 2015-08-01: 1.5 mg via TRANSDERMAL
  Filled 2015-08-01: qty 1

## 2015-08-01 MED ORDER — ALBUMIN HUMAN 5 % IV SOLN
INTRAVENOUS | Status: DC | PRN
  Administered 2015-08-01 (×3): via INTRAVENOUS

## 2015-08-01 MED ORDER — MIDAZOLAM HCL 2 MG/2ML IJ SOLN
INTRAMUSCULAR | Status: AC
  Filled 2015-08-01: qty 4

## 2015-08-01 MED ORDER — ROCURONIUM BROMIDE 50 MG/5ML IV SOLN
INTRAVENOUS | Status: AC
  Filled 2015-08-01: qty 1

## 2015-08-01 MED ORDER — PROPOFOL 500 MG/50ML IV EMUL
INTRAVENOUS | Status: DC | PRN
  Administered 2015-08-01: 75 ug/kg/min via INTRAVENOUS

## 2015-08-01 MED ORDER — DEXAMETHASONE SODIUM PHOSPHATE 4 MG/ML IJ SOLN
INTRAMUSCULAR | Status: AC
  Filled 2015-08-01: qty 3

## 2015-08-01 MED ORDER — LACTATED RINGERS IV SOLN
INTRAVENOUS | Status: DC | PRN
  Administered 2015-08-01: 12:00:00 via INTRAVENOUS

## 2015-08-01 MED ORDER — SURGIFOAM 100 EX MISC
CUTANEOUS | Status: DC | PRN
  Administered 2015-08-01: 09:00:00 via TOPICAL

## 2015-08-01 MED ORDER — DOCUSATE SODIUM 100 MG PO CAPS
100.0000 mg | ORAL_CAPSULE | Freq: Two times a day (BID) | ORAL | Status: DC
  Administered 2015-08-01 – 2015-08-04 (×6): 100 mg via ORAL
  Filled 2015-08-01 (×6): qty 1

## 2015-08-01 MED ORDER — OLMESARTAN-AMLODIPINE-HCTZ 40-10-25 MG PO TABS: 1.0000 | ORAL_TABLET | Freq: Every day | ORAL | Status: DC

## 2015-08-01 MED ORDER — ONDANSETRON HCL 4 MG/2ML IJ SOLN
INTRAMUSCULAR | Status: AC
  Filled 2015-08-01: qty 2

## 2015-08-01 MED ORDER — BUPIVACAINE-EPINEPHRINE (PF) 0.25% -1:200000 IJ SOLN
INTRAMUSCULAR | Status: AC
  Filled 2015-08-01: qty 30

## 2015-08-01 MED ORDER — SUCCINYLCHOLINE CHLORIDE 20 MG/ML IJ SOLN
INTRAMUSCULAR | Status: AC
  Filled 2015-08-01: qty 1

## 2015-08-01 MED ORDER — SODIUM CHLORIDE 0.9 % IJ SOLN
3.0000 mL | Freq: Two times a day (BID) | INTRAMUSCULAR | Status: DC
  Administered 2015-08-01 – 2015-08-02 (×2): 3 mL via INTRAVENOUS

## 2015-08-01 MED ORDER — VECURONIUM BROMIDE 10 MG IV SOLR
INTRAVENOUS | Status: DC | PRN
  Administered 2015-08-01 (×2): 2 mg via INTRAVENOUS
  Administered 2015-08-01 (×2): 3 mg via INTRAVENOUS

## 2015-08-01 MED ORDER — FLEET ENEMA 7-19 GM/118ML RE ENEM: 1.0000 | ENEMA | Freq: Once | RECTAL | Status: DC | PRN

## 2015-08-01 MED ORDER — MENTHOL 3 MG MT LOZG
1.0000 | LOZENGE | OROMUCOSAL | Status: DC | PRN
  Filled 2015-08-01: qty 9

## 2015-08-01 MED ORDER — HYDROMORPHONE HCL 1 MG/ML IJ SOLN
INTRAMUSCULAR | Status: AC
  Administered 2015-08-01: 0.5 mg via INTRAVENOUS
  Filled 2015-08-01: qty 1

## 2015-08-01 MED ORDER — SODIUM CHLORIDE 0.9 % IV SOLN
INTRAVENOUS | Status: DC
  Administered 2015-08-01 – 2015-08-02 (×3): via INTRAVENOUS

## 2015-08-01 MED ORDER — MIDAZOLAM HCL 5 MG/5ML IJ SOLN
INTRAMUSCULAR | Status: DC | PRN
  Administered 2015-08-01: 2 mg via INTRAVENOUS

## 2015-08-01 MED ORDER — DIAZEPAM 5 MG PO TABS
5.0000 mg | ORAL_TABLET | Freq: Four times a day (QID) | ORAL | Status: DC | PRN
  Administered 2015-08-01 – 2015-08-03 (×3): 5 mg via ORAL
  Filled 2015-08-01 (×2): qty 1

## 2015-08-01 MED ORDER — METOPROLOL SUCCINATE ER 50 MG PO TB24
50.0000 mg | ORAL_TABLET | Freq: Every day | ORAL | Status: DC
  Administered 2015-08-02 – 2015-08-03 (×2): 50 mg via ORAL
  Filled 2015-08-01 (×3): qty 1

## 2015-08-01 MED ORDER — STERILE WATER FOR INJECTION IJ SOLN
INTRAMUSCULAR | Status: AC
  Filled 2015-08-01: qty 10

## 2015-08-01 MED ORDER — BISACODYL 5 MG PO TBEC: 5.0000 mg | DELAYED_RELEASE_TABLET | Freq: Every day | ORAL | Status: DC | PRN

## 2015-08-01 MED ORDER — FENTANYL CITRATE (PF) 250 MCG/5ML IJ SOLN
INTRAMUSCULAR | Status: AC
  Filled 2015-08-01: qty 30

## 2015-08-01 MED ORDER — SODIUM CHLORIDE 0.9 % IV SOLN: 250.0000 mL | INTRAVENOUS | Status: DC

## 2015-08-01 MED ORDER — SODIUM CHLORIDE 0.9 % IJ SOLN: 3.0000 mL | INTRAMUSCULAR | Status: DC | PRN

## 2015-08-01 MED ORDER — VECURONIUM BROMIDE 10 MG IV SOLR
INTRAVENOUS | Status: AC
  Filled 2015-08-01: qty 10

## 2015-08-01 MED ORDER — 0.9 % SODIUM CHLORIDE (POUR BTL) OPTIME
TOPICAL | Status: DC | PRN
  Administered 2015-08-01: 1000 mL

## 2015-08-01 MED ORDER — THROMBIN 20000 UNITS EX SOLR
CUTANEOUS | Status: AC
  Filled 2015-08-01: qty 20000

## 2015-08-01 MED ORDER — EPHEDRINE SULFATE 50 MG/ML IJ SOLN
INTRAMUSCULAR | Status: DC | PRN
  Administered 2015-08-01: 15 mg via INTRAVENOUS
  Administered 2015-08-01 (×2): 10 mg via INTRAVENOUS

## 2015-08-01 MED ORDER — PANTOPRAZOLE SODIUM 40 MG PO TBEC: 40.0000 mg | DELAYED_RELEASE_TABLET | Freq: Every day | ORAL | Status: DC

## 2015-08-01 MED ORDER — NEOSTIGMINE METHYLSULFATE 10 MG/10ML IV SOLN
INTRAVENOUS | Status: DC | PRN
  Administered 2015-08-01: 3 mg via INTRAVENOUS

## 2015-08-01 MED ORDER — GLYCOPYRROLATE 0.2 MG/ML IJ SOLN
INTRAMUSCULAR | Status: DC | PRN
  Administered 2015-08-01: .4 mg via INTRAVENOUS

## 2015-08-01 MED ORDER — MORPHINE SULFATE (PF) 2 MG/ML IV SOLN
1.0000 mg | INTRAVENOUS | Status: DC | PRN
Start: 2015-08-01 — End: 2015-08-04
  Administered 2015-08-01 – 2015-08-02 (×5): 2 mg via INTRAVENOUS
  Filled 2015-08-01 (×5): qty 1

## 2015-08-01 MED ORDER — LIDOCAINE HCL (CARDIAC) 20 MG/ML IV SOLN
INTRAVENOUS | Status: DC | PRN
  Administered 2015-08-01: 50 mg via INTRAVENOUS
  Administered 2015-08-01: 80 mg via INTRAVENOUS

## 2015-08-01 MED ORDER — PHENYLEPHRINE 40 MCG/ML (10ML) SYRINGE FOR IV PUSH (FOR BLOOD PRESSURE SUPPORT)
PREFILLED_SYRINGE | INTRAVENOUS | Status: AC
  Filled 2015-08-01: qty 10

## 2015-08-01 MED ORDER — PANTOPRAZOLE SODIUM 40 MG PO TBEC
40.0000 mg | DELAYED_RELEASE_TABLET | Freq: Every day | ORAL | Status: DC
  Administered 2015-08-02 – 2015-08-04 (×3): 40 mg via ORAL
  Filled 2015-08-01 (×3): qty 1

## 2015-08-01 MED ORDER — ZOLPIDEM TARTRATE 5 MG PO TABS
5.0000 mg | ORAL_TABLET | Freq: Every evening | ORAL | Status: DC | PRN
  Administered 2015-08-02: 5 mg via ORAL
  Filled 2015-08-01: qty 1

## 2015-08-01 MED ORDER — HYDROCODONE-ACETAMINOPHEN 5-325 MG PO TABS
1.0000 | ORAL_TABLET | ORAL | Status: DC | PRN
  Administered 2015-08-02 – 2015-08-04 (×2): 2 via ORAL
  Filled 2015-08-01 (×2): qty 2

## 2015-08-01 MED ORDER — SENNOSIDES-DOCUSATE SODIUM 8.6-50 MG PO TABS: 1.0000 | ORAL_TABLET | Freq: Every evening | ORAL | Status: DC | PRN

## 2015-08-01 MED ORDER — ONDANSETRON HCL 4 MG/2ML IJ SOLN
INTRAMUSCULAR | Status: DC | PRN
  Administered 2015-08-01: 4 mg via INTRAVENOUS

## 2015-08-01 MED ORDER — METHYLENE BLUE 1 % INJ SOLN
INTRAMUSCULAR | Status: AC
  Filled 2015-08-01: qty 10

## 2015-08-01 MED ORDER — ALUM & MAG HYDROXIDE-SIMETH 200-200-20 MG/5ML PO SUSP: 30.0000 mL | Freq: Four times a day (QID) | ORAL | Status: DC | PRN

## 2015-08-01 MED ORDER — SODIUM CHLORIDE 0.9 % IV SOLN
INTRAVENOUS | Status: DC | PRN
  Administered 2015-08-01: 13:00:00 via INTRAVENOUS

## 2015-08-01 MED ORDER — ACETAMINOPHEN 325 MG PO TABS: 650.0000 mg | ORAL_TABLET | ORAL | Status: DC | PRN

## 2015-08-01 SURGICAL SUPPLY — 138 items
ADH SKN CLS APL DERMABOND .7 (GAUZE/BANDAGES/DRESSINGS) ×2
APL SKNCLS STERI-STRIP NONHPOA (GAUZE/BANDAGES/DRESSINGS) ×8
APPLIER CLIP 11 MED OPEN (CLIP) ×3
APR CLP MED 11 20 MLT OPN (CLIP) ×2
BENZOIN TINCTURE PRP APPL 2/3 (GAUZE/BANDAGES/DRESSINGS) ×4 IMPLANT
BLADE SURG 10 STRL SS (BLADE) ×3 IMPLANT
BLADE SURG ROTATE 9660 (MISCELLANEOUS) IMPLANT
BUR PRESCISION 1.7 ELITE (BURR) IMPLANT
BUR ROUND PRECISION 4.0 (BURR) IMPLANT
BUR SABER RD CUTTING 3.0 (BURR) IMPLANT
CAGE COUGAR ALIF MED 5 10 (Cage) ×1 IMPLANT
CAGE COUGAR ALIF MED 5 12 (Cage) ×1 IMPLANT
CARTRIDGE OIL MAESTRO DRILL (MISCELLANEOUS) ×4 IMPLANT
CLIP APPLIE 11 MED OPEN (CLIP) ×2 IMPLANT
CLIP LIGATING EXTRA MED SLVR (CLIP) ×3 IMPLANT
CLIP LIGATING EXTRA SM BLUE (MISCELLANEOUS) ×3 IMPLANT
CLSR STERI-STRIP ANTIMIC 1/2X4 (GAUZE/BANDAGES/DRESSINGS) ×3 IMPLANT
CONT SPEC STER OR (MISCELLANEOUS) ×3 IMPLANT
CORDS BIPOLAR (ELECTRODE) ×3 IMPLANT
COVER SURGICAL LIGHT HANDLE (MISCELLANEOUS) ×5 IMPLANT
DERMABOND ADVANCED (GAUZE/BANDAGES/DRESSINGS) ×1
DERMABOND ADVANCED .7 DNX12 (GAUZE/BANDAGES/DRESSINGS) ×2 IMPLANT
DIFFUSER DRILL AIR PNEUMATIC (MISCELLANEOUS) ×6 IMPLANT
DRAIN CHANNEL 15F RND FF W/TCR (WOUND CARE) ×3 IMPLANT
DRAPE C-ARM 42X72 X-RAY (DRAPES) ×6 IMPLANT
DRAPE INCISE IOBAN 66X45 STRL (DRAPES) ×1 IMPLANT
DRAPE ORTHO SPLIT 77X108 STRL (DRAPES) ×3
DRAPE POUCH INSTRU U-SHP 10X18 (DRAPES) ×3 IMPLANT
DRAPE SURG 17X23 STRL (DRAPES) ×12 IMPLANT
DRAPE SURG ORHT 6 SPLT 77X108 (DRAPES) ×2 IMPLANT
DRSG MEPILEX BORDER 4X12 (GAUZE/BANDAGES/DRESSINGS) ×3 IMPLANT
DRSG MEPILEX BORDER 4X8 (GAUZE/BANDAGES/DRESSINGS) IMPLANT
DURAPREP 26ML APPLICATOR (WOUND CARE) ×3 IMPLANT
ELECT BLADE 4.0 EZ CLEAN MEGAD (MISCELLANEOUS) ×6
ELECT CAUTERY BLADE 6.4 (BLADE) ×6 IMPLANT
ELECT REM PT RETURN 9FT ADLT (ELECTROSURGICAL) ×3
ELECTRODE BLDE 4.0 EZ CLN MEGD (MISCELLANEOUS) ×2 IMPLANT
ELECTRODE REM PT RTRN 9FT ADLT (ELECTROSURGICAL) ×2 IMPLANT
EVACUATOR SILICONE 100CC (DRAIN) ×3 IMPLANT
FEE INTRAOP MONITOR IMPULS NCS (MISCELLANEOUS) IMPLANT
GAUZE SPONGE 4X4 12PLY STRL (GAUZE/BANDAGES/DRESSINGS) ×4 IMPLANT
GAUZE SPONGE 4X4 16PLY XRAY LF (GAUZE/BANDAGES/DRESSINGS) ×13 IMPLANT
GLOVE BIO SURGEON STRL SZ7 (GLOVE) ×3 IMPLANT
GLOVE BIO SURGEON STRL SZ8 (GLOVE) ×3 IMPLANT
GLOVE BIOGEL PI IND STRL 7.0 (GLOVE) ×2 IMPLANT
GLOVE BIOGEL PI IND STRL 8 (GLOVE) ×4 IMPLANT
GLOVE BIOGEL PI INDICATOR 7.0 (GLOVE) ×1
GLOVE BIOGEL PI INDICATOR 8 (GLOVE) ×2
GLOVE SS BIOGEL STRL SZ 7.5 (GLOVE) ×2 IMPLANT
GLOVE SUPERSENSE BIOGEL SZ 7.5 (GLOVE) ×1
GOWN STRL REUS W/ TWL LRG LVL3 (GOWN DISPOSABLE) ×10 IMPLANT
GOWN STRL REUS W/ TWL XL LVL3 (GOWN DISPOSABLE) ×2 IMPLANT
GOWN STRL REUS W/TWL LRG LVL3 (GOWN DISPOSABLE) ×15
GOWN STRL REUS W/TWL XL LVL3 (GOWN DISPOSABLE) ×3
GUIDEWIRE BLUNT VIPER II 1.45 (WIRE) ×1 IMPLANT
GUIDEWIRE SHARP VIPER II (WIRE) ×6 IMPLANT
HEMOSTAT SURGICEL 2X14 (HEMOSTASIS) IMPLANT
INSERT FOGARTY 61MM (MISCELLANEOUS) IMPLANT
INSERT FOGARTY SM (MISCELLANEOUS) IMPLANT
INTRAOP MONITOR FEE IMPULS NCS (MISCELLANEOUS) ×2
INTRAOP MONITOR FEE IMPULSE (MISCELLANEOUS) ×1
IV CATH 14GX2 1/4 (CATHETERS) ×3 IMPLANT
KIT BASIN OR (CUSTOM PROCEDURE TRAY) ×3 IMPLANT
KIT POSITION SURG JACKSON T1 (MISCELLANEOUS) ×3 IMPLANT
KIT ROOM TURNOVER OR (KITS) ×6 IMPLANT
LOOP VESSEL MAXI BLUE (MISCELLANEOUS) IMPLANT
LOOP VESSEL MINI RED (MISCELLANEOUS) IMPLANT
MARKER SKIN DUAL TIP RULER LAB (MISCELLANEOUS) ×3 IMPLANT
MIX DBX 20CC MTF (Putty) ×1 IMPLANT
NDL HYPO 25GX1X1/2 BEV (NEEDLE) ×2 IMPLANT
NDL JAMSHIDI VIPER (NEEDLE) IMPLANT
NDL SPNL 18GX3.5 QUINCKE PK (NEEDLE) ×4 IMPLANT
NEEDLE HYPO 25GX1X1/2 BEV (NEEDLE) ×3 IMPLANT
NEEDLE JAMSHIDI VIPER (NEEDLE) ×6 IMPLANT
NEEDLE SPNL 18GX3.5 QUINCKE PK (NEEDLE) ×6 IMPLANT
NS IRRIG 1000ML POUR BTL (IV SOLUTION) ×3 IMPLANT
OIL CARTRIDGE MAESTRO DRILL (MISCELLANEOUS) ×6
PACK LAMINECTOMY ORTHO (CUSTOM PROCEDURE TRAY) ×3 IMPLANT
PACK UNIVERSAL I (CUSTOM PROCEDURE TRAY) ×3 IMPLANT
PAD ARMBOARD 7.5X6 YLW CONV (MISCELLANEOUS) ×12 IMPLANT
PATTIES SURGICAL .5 X1 (DISPOSABLE) ×3 IMPLANT
PATTIES SURGICAL .5 X3 (DISPOSABLE) IMPLANT
PATTIES SURGICAL .5X1.5 (GAUZE/BANDAGES/DRESSINGS) ×3 IMPLANT
PATTIES SURGICAL .75X.75 (GAUZE/BANDAGES/DRESSINGS) ×3 IMPLANT
PROBE PEDCLE PROBE MAGSTM DISP (MISCELLANEOUS) ×1 IMPLANT
ROD VIPOR2 70MM PRE LARDOSED (Rod) ×2 IMPLANT
SCREW POLY VIPER2 7X40MM (Screw) ×6 IMPLANT
SCREW SET SINGLE INNER MIS (Screw) ×6 IMPLANT
SPONGE INTESTINAL PEANUT (DISPOSABLE) ×12 IMPLANT
SPONGE LAP 18X18 X RAY DECT (DISPOSABLE) ×1 IMPLANT
SPONGE LAP 4X18 X RAY DECT (DISPOSABLE) ×1 IMPLANT
SPONGE SURGIFOAM ABS GEL 100 (HEMOSTASIS) ×6 IMPLANT
STAPLER VISISTAT 35W (STAPLE) IMPLANT
STRIP CLOSURE SKIN 1/2X4 (GAUZE/BANDAGES/DRESSINGS) ×1 IMPLANT
SURGIFLO W/THROMBIN 8M KIT (HEMOSTASIS) ×1 IMPLANT
SUT MNCRL AB 4-0 PS2 18 (SUTURE) ×6 IMPLANT
SUT PDS AB 1 CTX 36 (SUTURE) ×6 IMPLANT
SUT PROLENE 4 0 RB 1 (SUTURE) ×12
SUT PROLENE 4-0 RB1 .5 CRCL 36 (SUTURE) ×8 IMPLANT
SUT PROLENE 5 0 C 1 24 (SUTURE) IMPLANT
SUT PROLENE 5 0 C1 (SUTURE) ×1 IMPLANT
SUT PROLENE 5 0 CC1 (SUTURE) IMPLANT
SUT PROLENE 6 0 C 1 24 (SUTURE) IMPLANT
SUT PROLENE 6 0 C 1 30 (SUTURE) ×3 IMPLANT
SUT PROLENE 6 0 CC (SUTURE) IMPLANT
SUT SILK 0 TIES 10X30 (SUTURE) ×3 IMPLANT
SUT SILK 2 0 TIES 10X30 (SUTURE) ×6 IMPLANT
SUT SILK 2 0SH CR/8 30 (SUTURE) IMPLANT
SUT SILK 3 0 TIES 10X30 (SUTURE) ×6 IMPLANT
SUT SILK 3 0SH CR/8 30 (SUTURE) IMPLANT
SUT VIC AB 0 CT1 18XCR BRD 8 (SUTURE) ×4 IMPLANT
SUT VIC AB 0 CT1 27 (SUTURE) ×3
SUT VIC AB 0 CT1 27XBRD ANBCTR (SUTURE) ×2 IMPLANT
SUT VIC AB 0 CT1 8-18 (SUTURE) ×6
SUT VIC AB 1 CT1 18XCR BRD 8 (SUTURE) ×4 IMPLANT
SUT VIC AB 1 CT1 27 (SUTURE) ×6
SUT VIC AB 1 CT1 27XBRD ANBCTR (SUTURE) ×4 IMPLANT
SUT VIC AB 1 CT1 8-18 (SUTURE) ×6
SUT VIC AB 1 CTX 36 (SUTURE) ×6
SUT VIC AB 1 CTX36XBRD ANBCTR (SUTURE) ×4 IMPLANT
SUT VIC AB 2-0 CT1 36 (SUTURE) ×3 IMPLANT
SUT VIC AB 2-0 CT2 18 VCP726D (SUTURE) ×6 IMPLANT
SUT VIC AB 3-0 SH 27 (SUTURE) ×3
SUT VIC AB 3-0 SH 27X BRD (SUTURE) ×2 IMPLANT
SYR 20CC LL (SYRINGE) ×3 IMPLANT
SYR BULB IRRIGATION 50ML (SYRINGE) ×3 IMPLANT
SYR CONTROL 10ML LL (SYRINGE) ×3 IMPLANT
SYR TB 1ML LUER SLIP (SYRINGE) ×3 IMPLANT
TAP CANN VIPER2 DL 6.0 (TAP) ×2 IMPLANT
TAP CANN VIPER2 DL 7.0 (TAP) ×1 IMPLANT
TAPE CLOTH SURG 4X10 WHT LF (GAUZE/BANDAGES/DRESSINGS) ×1 IMPLANT
TAPE CLOTH SURG 6X10 WHT LF (GAUZE/BANDAGES/DRESSINGS) ×1 IMPLANT
TOWEL OR 17X24 6PK STRL BLUE (TOWEL DISPOSABLE) ×3 IMPLANT
TOWEL OR 17X26 10 PK STRL BLUE (TOWEL DISPOSABLE) ×3 IMPLANT
TRAY FOLEY CATH 16FR SILVER (SET/KITS/TRAYS/PACK) ×3 IMPLANT
TRAY FOLEY CATH 16FRSI W/METER (SET/KITS/TRAYS/PACK) ×3 IMPLANT
WATER STERILE IRR 1000ML POUR (IV SOLUTION) ×3 IMPLANT
YANKAUER SUCT BULB TIP NO VENT (SUCTIONS) ×3 IMPLANT

## 2015-08-01 NOTE — Transfer of Care (Signed)
Immediate Anesthesia Transfer of Care Note  Patient: Earma ReadingKeith A Mccarey  Procedure(s) Performed: Procedure(s) with comments: ANTERIOR LUMBAR FUSION 2 LEVELS (N/A) - Lumbar 4-5, lumbar 5-sacrum 1 Anterior lumbar interbody fusion POSTERIOR LUMBAR FUSION 2 LEVEL (N/A) - Lumbar 4-5, lumbar 5-sacrum 1 Posterior lumbar fusion with instrumentation ABDOMINAL EXPOSURE (N/A)  Patient Location: PACU  Anesthesia Type:General  Level of Consciousness: awake, sedated and patient cooperative  Airway & Oxygen Therapy: Patient Spontanous Breathing and Patient connected to face mask oxygen  Post-op Assessment: Report given to RN, Post -op Vital signs reviewed and stable, Patient moving all extremities and Patient moving all extremities X 4  Post vital signs: Reviewed and stable  Last Vitals:  Filed Vitals:   08/01/15 1845  BP: 92/75  Pulse: 85  Temp:   Resp: 23    Complications: No apparent anesthesia complications

## 2015-08-01 NOTE — H&P (Signed)
PREOPERATIVE H&P  Chief Complaint: low back pain  HPI: [name] is a 54 y.o. male who presents with ongoing pain in the low back and left leg  MRI reveals DDD and stenosis L4-S1  Patient has failed multiple forms of conservative care and continues to have pain (see office notes for additional details regarding the patient's full course of treatment)  Past Medical History  Diagnosis Date  . Hypertension   . Hyperlipidemia   . Gastroesophageal reflux disease   . Lumbar radiculopathy   . Hypermetropia   . Presbyopia   . Allergic rhinitis   . Lumbar radiculopathy 03-12-2015  . Back pain with radiation 08-04-2014  . Lumbosacral neuritis 05-02-2013  . Depression   . Anxiety   . History of hiatal hernia   . Osteoarthritis     DDD- lumbar  . DJD (degenerative joint disease)    Past Surgical History  Procedure Laterality Date  . Hemorroidectomy  11/12/2011  . Epidural injections    . Lumbar laminectomy    . Laminectomy  08/25/2006  . Back surgery  [id number]   Social History   Social History  . Marital Status: Married    Spouse Name: N/A  . Number of Children: N/A  . Years of Education: N/A   Social History Main Topics  . Smoking status: Former Smoker    Types: Cigarettes    Quit date: 03/20/2009  . Smokeless tobacco: None  . Alcohol Use: 1.2 - 1.8 oz/week    2-3 Glasses of wine, 0 Standard drinks or equivalent per week  . Drug Use: No  . Sexual Activity: Not Asked   Other Topics Concern  . None   Social History Narrative   Family History  Problem Relation Age of Onset  . Hypertension Mother   . Hypertension Father    No Known Allergies Prior to Admission medications   Medication Sig Start Date End Date Taking? Authorizing Provider  acetaminophen (TYLENOL) 500 MG tablet Take 500 mg by mouth every 6 (six) hours as needed.   Yes Historical Provider, MD  cyclobenzaprine (FLEXERIL) 5 MG tablet Take 5 mg by mouth at bedtime.   Yes Historical  Provider, MD  dexlansoprazole (DEXILANT) 60 MG capsule Take 60 mg by mouth daily before breakfast.    Yes Historical Provider, MD  HYDROcodone-acetaminophen (NORCO/VICODIN) 5-325 MG tablet Take 1 tablet by mouth every 6 (six) hours as needed for moderate pain.   Yes Historical Provider, MD  lisinopril (PRINIVIL,ZESTRIL) 20 MG tablet Take 20 mg by mouth daily.   Yes Historical Provider, MD  methocarbamol (ROBAXIN) 500 MG tablet Take 500 mg by mouth 2 (two) times daily as needed for muscle spasms.   Yes Historical Provider, MD  metoprolol succinate (TOPROL-XL) 50 MG 24 hr tablet Take 50 mg by mouth daily. Take with or immediately following a meal.   Yes Historical Provider, MD  Olmesartan-Amlodipine-HCTZ (TRIBENZOR) 40-10-25 MG TABS Take 1 tablet by mouth daily.    Yes Historical Provider, MD  omeprazole (PRILOSEC) 20 MG capsule Take 20 mg by mouth 2 (two) times daily as needed. OTC   Yes Historical Provider, MD  PARoxetine (PAXIL) 10 MG tablet Take 10 mg by mouth daily before breakfast.    Yes Historical Provider, MD  spironolactone (ALDACTONE) 25 MG tablet Take 25 mg by mouth daily.   Yes Historical Provider, MD  traMADol (ULTRAM) 50 MG tablet Take 50 mg by mouth every 6 (six) hours as needed for moderate pain.  Yes Historical Provider, MD  Vardenafil HCl (LEVITRA PO) Take 20 mg by mouth daily as needed.     Historical Provider, MD     All other systems have been reviewed and were otherwise negative with the exception of those mentioned in the HPI and as above.  Physical Exam: Filed Vitals:   08/01/15 0701  BP: 131/83  Pulse: 78  Temp: 97.8 F (36.6 C)  Resp: 20    General: Alert, no acute distress Cardiovascular: No pedal edema Respiratory: No cyanosis, no use of accessory musculature Skin: No lesions in the area of chief complaint Neurologic: Sensation intact distally Psychiatric: Patient is competent for consent with normal mood and affect Lymphatic: No axillary or cervical  lymphadenopathy  MUSCULOSKELETAL: + TTP low back  Assessment/Plan: Lumbar degenerative disc disease Plan for Procedure(s): ANTERIOR LUMBAR FUSION 2 LEVELS POSTERIOR LUMBAR FUSION 2 LEVEL   Emilee Hero, MD 08/01/2015 8:01 AM

## 2015-08-01 NOTE — Op Note (Signed)
    OPERATIVE REPORT  DATE OF SURGERY: 08/01/2015  PATIENT: [name], 54 y.o. male MRN: [id number]  DOB: 1961/01/21  PRE-OPERATIVE DIAGNOSIS: L4-5 and L5-S1 degenerative disc disease  POST-OPERATIVE DIAGNOSIS:  Same  PROCEDURE: Anterior exposure for L4-5 and L5-S1 degenerative disc surgery  SURGEON:  Gretta Beganodd Early, M.D.  Co-surgeon for the exposure Dr.Dumonski  ANESTHESIA:  Gen.    Total I/O In: 3930 [I.V.:3200; Blood:230; IV Piggyback:500] Out: 1550 [Urine:350; Blood:1200]  BLOOD ADMINISTERED: Cell Saver  DRAINS: None    COUNTS CORRECT:  YES  PLAN OF CARE: PACU   PATIENT DISPOSITION:  PACU - hemodynamically stable  PROCEDURE DETAILS: Patient was taken to the operative placed supine position where the area of the abdomen was prepped and draped in usual sterile fashion. Crosstable lateral injection was used closure of the visualization of the L4-5 and L5-S1 discs. This was marked on the anterior. Incision was made in the left paramedian area over this. This was carried down through the subcutaneous fat with electrocautery. The anterior rectus sheath was opened in line with the skin line incision. Patient had a large rectus muscle and this was mobilized circumferentially. The retroperitoneal space was opened and entered bluntly and the left lower quadrant below the level of the semilunar line. Retroperitoneal blunt dissection was used to mobilize the intraperitoneal contents to the right. The peritoneum was left intact and was not entered. The left ureter was identified and protected. Dissection was continued down to the level of the psoas muscle. The iliac vessels were identified and mobilized. The L5-S1 disc was exposed between the level of the iliac vessels. Middle sacral vessel were divided with ligaclips and divided. There was dense scarring over the anterior surface of the vertebral body and also large sharp osteophytes are present. Mobilization was continued on  additional clips were used for hemostasis. Next attention was turned to the L4-5 disc. The aorta and cava were mobilized to the right. The patient's iliolumbar vein was identified. The patient actually had 4 separate iliolumbar veins. These were all quite adherent to the disc. Osteophytes this area were also adherent to this. These 4 different vessels were individually ligated with 2-0 silk ties and divided. Several required additional 50 proline sutures for hemostasis. This was difficult due to the patient's large size but that eventually was mobilized. The Thompson retractor was then brought onto the field. The L5-S1 disc was exposed first. Reverse lip 200 cm blade was positioned to the right and left of the L5-S1 disc. Malleable retractors were used for superior and inferior exposure. Was confirmed to be L5-S1 disc with a needle and lateral crosstable C-arm film. The disc surgery will be dictated as a separate note by Dr.Dumonski.  Following this the retractors were removed and were replaced with the reverse lip 200 cm blade to the right and left of the L4-5 disc. Again the disc surgery will be dictated as a separate note. Following this the retractors were removed and hemostasis was adequate. The wounds were closed in the usual fashion.   Gretta Beganodd Early, M.D. 08/01/2015 3:22 PM

## 2015-08-01 NOTE — Anesthesia Procedure Notes (Signed)
Procedure Name: Intubation Date/Time: 08/01/2015 8:43 AM Performed by: Rogelia BogaMUELLER, Jshawn Hurta P Pre-anesthesia Checklist: Patient identified, Emergency Drugs available, Suction available, Patient being monitored and Timeout performed Patient Re-evaluated:Patient Re-evaluated prior to inductionOxygen Delivery Method: Circle system utilized Preoxygenation: Pre-oxygenation with 100% oxygen Intubation Type: IV induction Ventilation: Mask ventilation without difficulty and Oral airway inserted - appropriate to patient size Laryngoscope Size: Mac and 4 Grade View: Grade I Tube type: Oral Tube size: 7.5 mm Number of attempts: 1 Airway Equipment and Method: Stylet Placement Confirmation: ETT inserted through vocal cords under direct vision,  positive ETCO2 and breath sounds checked- equal and bilateral Secured at: 23 cm Tube secured with: Tape Dental Injury: Teeth and Oropharynx as per pre-operative assessment

## 2015-08-02 ENCOUNTER — Encounter (HOSPITAL_COMMUNITY): Payer: Self-pay | Admitting: General Practice

## 2015-08-02 LAB — CBC
HCT: 30 % — ABNORMAL LOW (ref 39.0–52.0)
Hemoglobin: 9.7 g/dL — ABNORMAL LOW (ref 13.0–17.0)
MCH: 28.4 pg (ref 26.0–34.0)
MCHC: 32.3 g/dL (ref 30.0–36.0)
MCV: 87.7 fL (ref 78.0–100.0)
PLATELETS: 137 10*3/uL — AB (ref 150–400)
RBC: 3.42 MIL/uL — ABNORMAL LOW (ref 4.22–5.81)
RDW: 13.8 % (ref 11.5–15.5)
WBC: 13.2 10*3/uL — AB (ref 4.0–10.5)

## 2015-08-02 MED ORDER — PNEUMOCOCCAL VAC POLYVALENT 25 MCG/0.5ML IJ INJ: 0.5000 mL | INJECTION | INTRAMUSCULAR | Status: DC

## 2015-08-02 MED ORDER — INFLUENZA VAC SPLIT QUAD 0.5 ML IM SUSY
0.5000 mL | PREFILLED_SYRINGE | INTRAMUSCULAR | Status: DC
  Filled 2015-08-02: qty 0.5

## 2015-08-02 NOTE — Progress Notes (Signed)
Subjective: Interval History: none.. Sitting up in chair.  No nausea.  Reports numbness in both hands  Objective: Vital signs in last 24 hours: Temp:  [97 F (36.1 C)-98.2 F (36.8 C)] 98.2 F (36.8 C) (10/13 0531) Pulse Rate:  [78-108] 108 (10/13 0531) Resp:  [19-31] 20 (10/13 0531) BP: (92-145)/(66-95) 105/70 mmHg (10/13 0531) SpO2:  [94 %-100 %] 99 % (10/13 0531)  Intake/Output from previous day: 10/12 0701 - 10/13 0700 In: 6690 [P.O.:240; I.V.:5200; Blood:500; IV Piggyback:750] Out: 1600 [Urine:400; Blood:1200] Intake/Output this shift: Total I/O In: 1036.3 [P.O.:225; I.V.:761.3; IV Piggyback:50] Out: -   Physical exam: Sitting up in chair comfortable. 2+ dorsalis pedis pulses bilaterally  Lab Results:  Recent Labs  08/02/15 0835  WBC 13.2*  HGB 9.7*  HCT 30.0*  PLT 137*   BMET No results for input(s): NA, K, CL, CO2, GLUCOSE, BUN, CREATININE, CALCIUM in the last 72 hours.  Studies/Results: Dg Chest 2 View  07/24/2015  CLINICAL DATA:  Hypertension.  Prior smoker. EXAM: CHEST  2 VIEW COMPARISON:  07/31/2009 . FINDINGS: Mediastinum and hilar structures normal. Lungs are clear. No pleural effusion pneumothorax. Heart size normal. Diffuse degenerative change thoracic spine. IMPRESSION: No acute cardiopulmonary disease. Electronically Signed   By: Maisie Fus  Register   On: 07/24/2015 13:32   Dg Lumbar Spine 2-3 Views  08/01/2015  CLINICAL DATA:  Fusion L4-S1 EXAM: LUMBAR SPINE - 2-3 VIEW COMPARISON:  MRI 08/29/2014. FINDINGS: First intraoperative spot image demonstrates the localize instrument overlying the L5 vertebral body. Subsequent imaging demonstrate changes of anterior fusion at L4-5 and L5-S1. IMPRESSION: Intraoperative imaging as above. Electronically Signed   By: Charlett Nose M.D.   On: 08/01/2015 18:21   Dg Lumbar Spine Complete  08/01/2015  CLINICAL DATA:  Lumbar disc disease. EXAM: LUMBAR SPINE - COMPLETE 4+ VIEW; DG C-ARM GT 120 MIN COMPARISON:  MRI dated  08/29/2014 FINDINGS: Four C-arm images demonstrate the patient undergoing anterior interbody fusion at L4-5 and L5-S1. On image 7 the hardware appears in good position. IMPRESSION: Anterior fusions performed at L4-5 and L5-S1. Electronically Signed   By: Francene Boyers M.D.   On: 08/01/2015 18:25   Dg C-arm Gt 120 Min  08/01/2015  CLINICAL DATA:  Lumbar disc disease. EXAM: LUMBAR SPINE - COMPLETE 4+ VIEW; DG C-ARM GT 120 MIN COMPARISON:  MRI dated 08/29/2014 FINDINGS: Four C-arm images demonstrate the patient undergoing anterior interbody fusion at L4-5 and L5-S1. On image 7 the hardware appears in good position. IMPRESSION: Anterior fusions performed at L4-5 and L5-S1. Electronically Signed   By: Francene Boyers M.D.   On: 08/01/2015 18:25   Dg Or Local Abdomen  08/01/2015  CLINICAL DATA:  Checking for retained instruments after anterior lumbar fusion. EXAM: OR LOCAL ABDOMEN COMPARISON:  None. FINDINGS: There are no retained instruments after anterior lumbar fusion. Fusion hardware is in place. Surgical staples in place. Bowel gas pattern is normal. IMPRESSION: Benign appearing abdomen.  No retained instruments. Electronically Signed   By: Francene Boyers M.D.   On: 08/01/2015 15:30   Anti-infectives: Anti-infectives    Start     Dose/Rate Route Frequency Ordered Stop   08/02/15 0000  ceFAZolin (ANCEF) IVPB 1 g/50 mL premix     1 g 100 mL/hr over 30 Minutes Intravenous Every 8 hours 08/01/15 2059 08/02/15 1559   08/01/15 0815  ceFAZolin (ANCEF) IVPB 2 g/50 mL premix     2 g 100 mL/hr over 30 Minutes Intravenous To ShortStay Surgical 07/31/15 0846 08/01/15 1702  Assessment/Plan: s/p Procedure(s) with comments: ANTERIOR LUMBAR FUSION 2 LEVELS (N/A) - Lumbar 4-5, lumbar 5-sacrum 1 Anterior lumbar interbody fusion POSTERIOR LUMBAR FUSION 2 LEVEL (N/A) - Lumbar 4-5, lumbar 5-sacrum 1 Posterior lumbar fusion with instrumentation ABDOMINAL EXPOSURE (N/A) Stable POD 1.  Expected post op  soreness./ ? hand numbness.  Will not follow actively.  Please call if we can assist   LOS: 1 day   [name] 08/02/2015, 9:37 AM

## 2015-08-02 NOTE — Progress Notes (Signed)
    Patient doing well Has been OOB Denies leg pain + LBP over region of incisions   Physical Exam: Filed Vitals:   08/02/15 0531  BP: 105/70  Pulse: 108  Temp: 98.2 F (36.8 C)  Resp: 20   Siting up in chair, appears comfortable Dressing in place NVI  POD #1 s/p L4-S1 A/P fusion, doing well  - up with PT/OT, encourage ambulation - Percocet for pain, Valium for muscle spasms - likely d/c home tomorrow - will check hg level today

## 2015-08-02 NOTE — Evaluation (Signed)
Occupational Therapy Evaluation Patient Details Name: [name] MRN: [id number] DOB: May 07, 1961 Today's Date: 08/02/2015    History of Present Illness ANTERIOR LUMBAR FUSION 2 LEVELS (N/A) - Lumbar 4-5, lumbar 5-sacrum 1 Anterior lumbar interbody fusion. POSTERIOR LUMBAR FUSION 2 LEVEL (N/A) - Lumbar 4-5, lumbar 5-sacrum 1 Posterior lumbar fusion with instrumentation.    Clinical Impression   Pt reports he was independent with ADLs PTA. Pt currently min assist overall with mod assist for LB ADLs and min +2 for functional mobility. Co-treat with PT for pt and therapist safety. Educated pt on use of AE for LB ADLs to maintain back precautions; pt verbalized understanding. Pt plan to d/c home with 24/7 supervision from family. Pt would benefit from continued OT services in order to maximize independence and safety LB ADLs and functional transfers while adhering to back precautions.      Follow Up Recommendations  No OT follow up;Supervision - Intermittent    Equipment Recommendations  3 in 1 bedside comode;Other (comment) (AE?)    Recommendations for Other Services       Precautions / Restrictions Precautions Precautions: Back;Fall Precaution Booklet Issued: Yes (comment) Required Braces or Orthoses: Other Brace/Splint Restrictions Weight Bearing Restrictions: No      Mobility Bed Mobility               General bed mobility comments: Pt in recliner, returned to recliner at end of session  Transfers Overall transfer level: Needs assistance Equipment used: Rolling walker (2 wheeled) Transfers: Sit to/from Stand Sit to Stand: Min assist;+2 physical assistance;+2 safety/equipment         General transfer comment: Min assist +2 for sit <> stand, min guard +2 for mobility    Balance Overall balance assessment: Needs assistance         Standing balance support: Bilateral upper extremity supported Standing balance-Leahy Scale: Poor Standing balance comment:  RW for support                            ADL Overall ADL's : Needs assistance/impaired Eating/Feeding: Set up;Sitting   Grooming: Supervision/safety;Sitting       Lower Body Bathing: Moderate assistance;Sit to/from stand Lower Body Bathing Details (indicate cue type and reason): Mod A to adhere to back precautions     Lower Body Dressing: Moderate assistance;Sit to/from stand Lower Body Dressing Details (indicate cue type and reason): Mod A to adhere to back precautions Toilet Transfer: Minimal assistance;+2 for physical assistance;+2 for safety/equipment;Ambulation           Functional mobility during ADLs: Minimal assistance;+2 for physical assistance;+2 for safety/equipment;Rolling walker General ADL Comments: Pts daughter present for OT eval. Pt educated on use of AE for LB ADLs; pt stated that wife and family could help with ADLs as needed but thinks a reacher may be handy. SpO2 dropped to 70s while scooting to edge of chair without O2 on, pt educated to take deep breaths through nose. SpO2 taken at 99% prior to mobility, 99% following mobililty. O2 reapplied     Vision     Perception     Praxis      Pertinent Vitals/Pain Pain Assessment: Faces Faces Pain Scale: Hurts whole lot Pain Location: back Pain Descriptors / Indicators: Grimacing Pain Intervention(s): Limited activity within patient's tolerance;Monitored during session     Hand Dominance Right   Extremity/Trunk Assessment Upper Extremity Assessment Upper Extremity Assessment: Overall WFL for tasks assessed   Lower Extremity Assessment  Lower Extremity Assessment: Defer to PT evaluation       Communication Communication Communication: No difficulties   Cognition Arousal/Alertness: Awake/alert Behavior During Therapy: WFL for tasks assessed/performed Overall Cognitive Status: Within Functional Limits for tasks assessed                     General Comments       Exercises        Shoulder Instructions      Home Living Family/patient expects to be discharged to:: Private residence Living Arrangements: Spouse/significant other Available Help at Discharge: Family;Available 24 hours/day (wife 24/7 for first week; family available after first week) Type of Home: House Home Access: Stairs to enter Entergy CorporationEntrance Stairs-Number of Steps: 2 in front    Home Layout: Two level;Able to live on main level with bedroom/bathroom     Bathroom Shower/Tub: Producer, television/film/videoWalk-in shower   Bathroom Toilet: Standard Bathroom Accessibility: Yes How Accessible: Accessible via walker Home Equipment: None          Prior Functioning/Environment Level of Independence: Independent             OT Diagnosis: Generalized weakness;Acute pain   OT Problem List: Decreased activity tolerance;Impaired balance (sitting and/or standing);Decreased safety awareness;Decreased knowledge of use of DME or AE;Decreased knowledge of precautions;Pain   OT Treatment/Interventions: Self-care/ADL training;DME and/or AE instruction;Patient/family education    OT Goals(Current goals can be found in the care plan section) Acute Rehab OT Goals Patient Stated Goal: return to PLOF OT Goal Formulation: With patient Time For Goal Achievement: 08/16/15 Potential to Achieve Goals: Good ADL Goals Pt Will Perform Lower Body Bathing: with supervision;sit to/from stand (AE?) Pt Will Perform Lower Body Dressing: with supervision;sit to/from stand (AE?) Pt Will Transfer to Toilet: with modified independence;ambulating (BSC over toilet) Pt Will Perform Tub/Shower Transfer: with supervision;ambulating;3 in 1;rolling walker  OT Frequency: Min 2X/week   Barriers to D/C:            Co-evaluation PT/OT/SLP Co-Evaluation/Treatment: Yes Reason for Co-Treatment: For patient/therapist safety   OT goals addressed during session: ADL's and self-care      End of Session Equipment Utilized During Treatment: Gait  belt;Rolling walker;Oxygen  Activity Tolerance: Patient tolerated treatment well Patient left: in chair;with call bell/phone within reach;with nursing/sitter in room;with family/visitor present   Time: [id number] OT Time Calculation (min): 29 min Charges:  OT General Charges $OT Visit: 1 Procedure OT Evaluation $Initial OT Evaluation Tier I: 1 Procedure G-Codes:     Gaye AlkenBailey A Marlos Carmen M.S., OTR/L Pager: [phone]   08/02/2015, 11:35 AM

## 2015-08-02 NOTE — Evaluation (Signed)
Physical Therapy Evaluation Patient Details Name: ELLEN MAYOL MRN: [id number] DOB: 07/19/1961 Today's Date: 08/02/2015   History of Present Illness  54 y.o. male admitted to [facility] on 08/01/15 for elective anterior and posterior lumbar fusion L4/5, L5-S1.  Pt with significant PMHx ofHTN, depression/anxiety, and multiple back surgeries.    Clinical Impression  Pt is POD #1 and was able to walk with RW to hallway with significant effort and fatigue.  He is very slow to move and demonstrated some weakness of his left leg as well as continued numbness.  He may need more than one more acute day for therapy to ensure safety at discharge (we will see how he progresses).   PT to follow acutely for deficits listed below.       Follow Up Recommendations Home health PT;Supervision for mobility/OOB    Equipment Recommendations  Rolling walker with 5" wheels;3in1 (PT)    Recommendations for Other Services   NA    Precautions / Restrictions Precautions Precautions: Back;Fall Precaution Booklet Issued: Yes (comment) Precaution Comments: handout given and reviewed with pt during session.  Required Braces or Orthoses: Spinal Brace Spinal Brace: Thoracolumbosacral orthotic;Other (comment) (with thigh component) Spinal Brace Comments: donned in sitting per pt report      Mobility  Bed Mobility               General bed mobility comments: Pt in recliner, returned to recliner at end of session  Transfers Overall transfer level: Needs assistance Equipment used: Rolling walker (2 wheeled) Transfers: Sit to/from Stand Sit to Stand: +2 physical assistance;Min assist         General transfer comment: Two person min assist to help with the transition from hands on armrests of recliner to hands on RW due to flexed knee posture as he was coming to stand.   Ambulation/Gait Ambulation/Gait assistance: +2 physical assistance;Min assist Ambulation Distance (Feet): 35 Feet Assistive device:  Rolling walker (2 wheeled) Gait Pattern/deviations: Step-through pattern;Decreased dorsiflexion - left;Decreased weight shift to left;Shuffle Gait velocity: decreased Gait velocity interpretation: <1.8 ft/sec, indicative of risk for recurrent falls General Gait Details: Pt with very slow, labored gait pattern, decreased foot clearance on his left and some knee buckling with fatigue.  Very heavy reliance on both arms to support him on RW during gait.  Verbal cues to take deep breaths.          Balance Overall balance assessment: Needs assistance Sitting-balance support: Feet supported;Bilateral upper extremity supported Sitting balance-Leahy Scale: Poor     Standing balance support: Bilateral upper extremity supported Standing balance-Leahy Scale: Poor                               Pertinent Vitals/Pain Pain Assessment: 0-10 Faces Pain Scale: Hurts whole lot Pain Location: back Pain Descriptors / Indicators: Grimacing;Guarding Pain Intervention(s): Limited activity within patient's tolerance;Monitored during session;Repositioned    Home Living Family/patient expects to be discharged to:: Private residence Living Arrangements: Spouse/significant other Available Help at Discharge: Family;Available 24 hours/day (wife 24/7 for first week; family available after first week) Type of Home: House Home Access: Stairs to enter   [employer] of Steps: 2 in front  Home Layout: Two level;Able to live on main level with bedroom/bathroom Home Equipment: None      Prior Function Level of Independence: Independent               Hand Dominance   Dominant Hand:  Right    Extremity/Trunk Assessment   Upper Extremity Assessment: Defer to OT evaluation           Lower Extremity Assessment: LLE deficits/detail   LLE Deficits / Details: left leg with weakness and foot clearance difficulty during gait.  Once fatigued left knee would buckle and pt had  difficulty clearing his left foot throughout gait. Stiff legged gait pattern likely due to tight component lock out.   Cervical / Trunk Assessment: Other exceptions  Communication   Communication: No difficulties  Cognition Arousal/Alertness: Awake/alert Behavior During Therapy: WFL for tasks assessed/performed Overall Cognitive Status: Within Functional Limits for tasks assessed                               Assessment/Plan    PT Assessment Patient needs continued PT services  PT Diagnosis Difficulty walking;Abnormality of gait;Generalized weakness;Acute pain   PT Problem List Decreased strength;Decreased activity tolerance;Decreased balance;Decreased mobility;Decreased knowledge of use of DME;Impaired sensation;Pain  PT Treatment Interventions DME instruction;Gait training;Functional mobility training;Stair training;Therapeutic activities;Therapeutic exercise;Balance training;Neuromuscular re-education;Patient/family education;Modalities   PT Goals (Current goals can be found in the Care Plan section) Acute Rehab PT Goals Patient Stated Goal: to get better/stronger, reduce pain PT Goal Formulation: With patient Time For Goal Achievement: 08/09/15 Potential to Achieve Goals: Good    Frequency Min 5X/week        Co-evaluation PT/OT/SLP Co-Evaluation/Treatment: Yes Reason for Co-Treatment: For patient/therapist safety PT goals addressed during session: Mobility/safety with mobility;Balance;Proper use of DME;Strengthening/ROM         End of Session   Activity Tolerance: Patient limited by pain Patient left: in chair;with call bell/phone within reach;with family/visitor present           Time: [id number] PT Time Calculation (min) (ACUTE ONLY): 28 min   Charges:   PT Evaluation $Initial PT Evaluation Tier I: 1 Procedure          Elizeth Weinrich B. Jakhai Fant, PT, DPT [phone]   08/02/2015, 5:31 PM

## 2015-08-02 NOTE — Plan of Care (Signed)
Problem: Consults Goal: Diagnosis - Spinal Surgery Thoraco/Lumbar Spine Fusion     

## 2015-08-02 NOTE — Op Note (Addendum)
NAMEJOHM, PFANNENSTIEL NO.:  [id number]  MEDICAL RECORD NO.:  [id number]  LOCATION:  5N06C                        FACILITY:  [facility]  PHYSICIAN:  Estill Bamberg, MD      DATE OF BIRTH:  [birth year]  DATE OF PROCEDURE:  08/01/2015                              OPERATIVE REPORT   PREOPERATIVE DIAGNOSES: 1. Degenerative disc disease, L4-5 and L5-S1. 2. Left leg pain. 3. Spinal stenosis, L4-5 and L5-S1. 4. Status post previous decompression, L4 to S1.  POSTOPERATIVE DIAGNOSES: 1. Degenerative disc disease, L4-5 and L5-S1. 2. Left leg pain. 3. Spinal stenosis, L4-5 and L5-S1. 4. Status post previous decompression, L4 to S1.  PROCEDURE: 1. Anterior lumbar interbody fusion, L4-5 and L5-S1. 2. Insertion of interbody device x2 (DePuy Cougar intervertebral     spacers x2). 3. Placement of anterior instrumentation, L4-5 and L5-S1. 4. Posterior spinal fusion, L4-5 and L5-S1. 5. Placement of posterior segmental instrumentation, L4 to S1. 6. Use of morselized allograft-DBX mix. 7. First assistant for a retroperitoneal exposure performed by Dr.     Gretta Began.  SURGEON:  Estill Bamberg, M.D.  ASSISTANT:  Jason Coop, PA-C.  ANESTHESIA:  General endotracheal anesthesia.  COMPLICATIONS:  None.  DISPOSITION:  Stable.  ESTIMATED BLOOD LOSS:  Approximately 1200 mL, part of which was re- transfused using Cell Saver.  INDICATIONS FOR SURGERY:  Briefly, Mr. Enrique is a very pleasant 54- year-old male, who did initially present to me, on June 26, 2015 with ongoing and severe pain in the low back.  He did also have ongoing pain in the left leg.  Of note, he is status post 2 decompressive procedures in the past.  The pain was ongoing and substantial.  He did fail multiple forms of conservative care, and did continue to have ongoing pain.  Given the findings on his MRI, we did discuss proceeding with a fusion as reflected above.  Of note, the patient did  fully understand the risks and limitations of the procedure.  Of particular note, he did understand that surgery may or may not alleviate his ongoing pain in his low back.  He did wish to proceed with surgery.  OPERATIVE DETAILS:  On August 01, 2015, the patient was brought to Surgery and general endotracheal anesthesia was administered.  The patient was placed supine on the hospital bed.  All bony prominences were padded.  The abdomen was prepped and draped in the usual sterile fashion and a time-out was performed.  A longitudinal incision was made to the left of the midline.  A retroperitoneal approach was then performed by Dr. Gretta Began.  I did function as his first assistant for the approach.  Of particular note, the approach was noted to be a very challenging approach.  There were various anomalies in his vascular system, which did necessitate a very meticulous and labor some retroperitoneal approach.  However, a retroperitoneal approach was safely and successfully accomplished.  I then initially turned my attention to the L5-S1 intervertebral space.  I then performed a thorough and complete L5-S1 intervertebral discectomy to the level of the posterior longitudinal ligament.  The endplates were prepared and the appropriate size interbody spacer was then packed  with DBX mix and tamped into position in the usual fashion.  I was very pleased with the press fit of the implant.  I did liberally use AP and lateral fluoroscopy, and again, was very pleased with the resting position of the implant.  I then proceeded with the anterior instrumentation.  I did remove osteophytes from the S1 vertebral body.  I then used an awl to prepare trajectory for vertebral body screw.  A 25 mm screw was placed. A washer was used as a buttress and was secured over the anterior aspect of the intervertebral spacer to help with the fixation and to help prevent anterior migration of the implant.  I was very  pleased with the press fit of the instrumentation and the screw.  The wound was then irrigated.  My attention was then turned to the L4-5 intervertebral space with the assistance of Dr. Tawanna Coolerodd Early.  I then again performed a thorough and complete L4-5 intervertebral discectomy.  Once again, the endplates were prepared, and once again, the appropriate size interbody spacer was packed with DBX mix and tamped into position in the usual fashion.  Once again, an awl was used to prepare the trajectory of a vertebral body screw at L5.  A screw was then advanced into the L5 vertebral body, and a washer was used as a buttress for anterior instrumentation to help aid in fixation and to help minimize the risk of anterior migration of the implant.  The wound was then copiously irrigated.  The fascia was then closed using #1 PDS.  The subcutaneous layer was closed using 0 Vicryl followed by 2-0 Vicryl and the skin was closed using 3-0 Monocryl.  Benzoin and Steri-Strips were applied followed by sterile dressing.  The patient was then rolled prone onto a Jackson spinal bed.  Neurologic monitoring was utilized and was set up by the monitor technician.  The back was then prepped and draped and all bony prominences were padded. Antibiotics were again administered.  I then made 2 paramedian incisions just lateral to the L4, L5, and S1 pedicles.  I then advanced Jamshidi needles across the L4, L5, and S1 pedicles bilaterally, liberally using AP and lateral fluoroscopy.  Guidewires were placed through the Jamshidi needles.  I then used a 6 mm tap to prepare the trajectory of the L4, L5, and S1 pedicles.  I then used triggered EMG to test each of the taps, there was no tap that tested below 20 milliamps.  The taps were then removed.  Then on the left side, I did decorticate the posterior elements to help aid in the success of the fusion.  Additional bone graft in the form of DBX mix was placed over the  decorticated posterior elements. I then advanced 7 x 40 mm screws at each pedicle at L4, L5, and S1 bilaterally.   A 70 mm rod was placed bilaterally.  Caps were then placed and a final locking procedure was performed.  Once again, I was very pleased with the AP and lateral fluoroscopic images.  The wound was then copiously irrigated. The wound was then closed in layers using #1 Vicryl followed by 2-0 Vicryl, followed by 3-0 Monocryl.  Benzoin and Steri-Strips were applied followed by sterile dressing.  All instrument counts were correct at the termination of the procedure.  Of note, Jason CoopKayla McKenzie was my assistant throughout the surgery from start to finish, and did aid in retraction, suctioning, and closure.  Of note, there was no abnormal EMG  activity noted throughout the entire surgery from start to finish.     Estill Bamberg, MD     MD/MEDQ  D:  08/01/2015  T:  08/02/2015  Job:  [id number]

## 2015-08-03 MED ORDER — PNEUMOCOCCAL VAC POLYVALENT 25 MCG/0.5ML IJ INJ
0.5000 mL | INJECTION | INTRAMUSCULAR | Status: AC
  Administered 2015-08-04: 0.5 mL via INTRAMUSCULAR
  Filled 2015-08-03: qty 0.5

## 2015-08-03 MED ORDER — INFLUENZA VAC SPLIT QUAD 0.5 ML IM SUSY
0.5000 mL | PREFILLED_SYRINGE | INTRAMUSCULAR | Status: AC
  Administered 2015-08-04: 0.5 mL via INTRAMUSCULAR

## 2015-08-03 MED FILL — Sodium Chloride IV Soln 0.9%: INTRAVENOUS | Qty: 3000 | Status: AC

## 2015-08-03 MED FILL — Heparin Sodium (Porcine) Inj 1000 Unit/ML: INTRAMUSCULAR | Qty: 30 | Status: AC

## 2015-08-03 NOTE — Progress Notes (Signed)
    Patient doing well, improving LBP, rough night hard to get comfortable, R leg numbness, feels better today, slow progress in PT/OT yesterday. Minimal PO intake.   Physical Exam: BP 106/57 mmHg  Pulse 101  Temp(Src) 99.7 F (37.6 C) (Oral)  Resp 20  Ht 5\' 9"  (1.753 m)  Wt 115.582 kg (254 lb 13 oz)  BMI 37.61 kg/m2  SpO2 94%  ANT/POST Dressinsg in place, CDI, pt shifting positions frequently, strength full BIL LE, SCD's in place NVI  POD #2 s/p L4-S1 Anterior/Posterior Fusion doing well   - up with PT/OT, encourage ambulation - Percocet for pain, Valium for muscle spasms - likely d/c home today vs tomorrow pending PT progress - Advance to normal diet, PO intake encouraged

## 2015-08-03 NOTE — Progress Notes (Signed)
Physical Therapy Treatment Patient Details Name: Brandon ReadingKeith A Wiederhold MRN: [id number] DOB: 07-27-1961 Today's Date: 08/03/2015    History of Present Illness 54 y.o. male [facility]01/15 for elective anterior and posterior lumbar fusion L4/5, L5-S1.  Pt with significant PMHx ofHTN, depression/anxiety, and multiple back surgeries.      PT Comments    Pt is progressing slowly with his mobility. He has very effortful walking and fatigues quickly.  He was able to initiate stair training simulating home entry, but I would like to practice stair training again prior to d/c.  Wife is very concerned about taking him home, so I would recommend one more acute day to reinforce teaching, gait training, and stair practice with her present and participating.   Follow Up Recommendations  Home health PT;Supervision for mobility/OOB     Equipment Recommendations  Rolling walker with 5" wheels;3in1 (PT)    Recommendations for Other Services   NA     Precautions / Restrictions Precautions Precautions: Back;Fall Precaution Booklet Issued: Yes (comment) Required Braces or Orthoses: Spinal Brace Spinal Brace: Thoracolumbosacral orthotic;Other (comment) Spinal Brace Comments: donned in sitting per pt report    Mobility  Bed Mobility               General bed mobility comments: seated in recliner chair  Transfers Overall transfer level: Needs assistance Equipment used: Rolling walker (2 wheeled) Transfers: Sit to/from Stand Sit to Stand: +2 safety/equipment;Min assist Stand pivot transfers: +2 safety/equipment;Min assist       General transfer comment: Two person min assist to help pt transition to and from sit/stand.  Pt standing slowly over flexed knees, assist to lock and unlock brace during transitions as well.   Ambulation/Gait Ambulation/Gait assistance: +2 safety/equipment;Min assist Ambulation Distance (Feet): 60 Feet Assistive device: Rolling walker (2 wheeled) Gait  Pattern/deviations: Step-through pattern;Decreased stance time - left;Decreased dorsiflexion - left;Shuffle Gait velocity: decrease Gait velocity interpretation: <1.8 ft/sec, indicative of risk for recurrent falls General Gait Details: heavy reliance on upper extremity support during gait, poor foot clearance on the left with some buckling at the left knee when pt fatiues.     Stairs Stairs: Yes Stairs assistance: +2 safety/equipment (min) Stair Management: Two rails;Step to pattern;Forwards Number of Stairs: 5 (x2) General stair comments: Verbal cues for safest LE sequencing given brace lock out and left leg being weaker. Assist at trunk for balance, and heavy reliance on railings for support.          Balance Overall balance assessment: Needs assistance Sitting-balance support: Feet supported;Bilateral upper extremity supported Sitting balance-Leahy Scale: Fair     Standing balance support: Bilateral upper extremity supported Standing balance-Leahy Scale: Poor                      Cognition Arousal/Alertness: Lethargic Behavior During Therapy: WFL for tasks assessed/performed Overall Cognitive Status: Difficult to assess                             Pertinent Vitals/Pain Pain Assessment: Faces Faces Pain Scale: Hurts whole lot Pain Location: back left leg Pain Descriptors / Indicators: Aching;Burning;Grimacing;Guarding Pain Intervention(s): Limited activity within patient's tolerance;Monitored during session;Repositioned           PT Goals (current goals can now be found in the care plan section) Acute Rehab PT Goals Patient Stated Goal: to get better/stronger, reduce pain Progress towards PT goals: Progressing toward goals  Frequency  Min 5X/week    PT Plan Current plan remains appropriate       End of Session Equipment Utilized During Treatment: Back brace Activity Tolerance: Patient limited by pain;Patient limited by lethargy Patient  left: in chair;with call bell/phone within reach;with family/visitor present     Time: 1003-1029 PT Time Calculation (min) (ACUTE ONLY): 26 min  Charges:  $Gait Training: 23-37 mins                      Lilybeth Vien B. Jaylah Goodlow, PT, DPT [phone]   08/03/2015, 5:31 PM

## 2015-08-03 NOTE — Progress Notes (Signed)
Occupational Therapy Treatment Patient Details Name: [name] MRN: [id number] DOB: Dec 25, 1960 Today's Date: 08/03/2015    History of present illness 54 y.o. male [facility] for elective anterior and posterior lumbar fusion L4/5, L5-S1.  Pt with significant PMHx ofHTN, depression/anxiety, and multiple back surgeries.     OT comments  Skilled OT session limited this visit due to pt. Very groggy and falling in/out of sleep during attempted tasks.  Wife present and reports she works F/T and will not be available to assist at home but they are looking into family and friends for 24/7 along with possible CNA help.  Encouraged her to learn how to assist with don/doff brace as she will need to feel comfortable with this for herself and to educate others.  Next session to continue with safety during functional mobility and have spouse and/or other family demonstrate don/doff brace.    Follow Up Recommendations  No OT follow up;Supervision - Intermittent    Equipment Recommendations  3 in 1 bedside comode;Other (comment)    Recommendations for Other Services      Precautions / Restrictions Precautions Precautions: Back;Fall Required Braces or Orthoses: Spinal Brace Spinal Brace: Thoracolumbosacral orthotic;Other (comment) Restrictions Weight Bearing Restrictions: No       Mobility Bed Mobility               General bed mobility comments: seated eob upon arrival  Transfers Overall transfer level: Needs assistance Equipment used: Rolling walker (2 wheeled) Transfers: Sit to/from Stand;Stand Pivot Transfers Sit to Stand: +2 physical assistance;Min assist Stand pivot transfers: +2 physical assistance;Min assist       General transfer comment: Two person min assist to help with the transition from hands on armrests of recliner to hands on RW due to flexed knee posture as he was coming to stand.     Balance                                    ADL Overall ADL's : Needs assistance/impaired                 Upper Body Dressing : Maximal assistance;Sitting Upper Body Dressing Details (indicate cue type and reason): max a to don brace from CNA as i was entering the room, pts. spouse present and states "ive just been watching for now" when asked about her attempting to don the brace, reviewed and encouraged pts. spouse to begin hands on training with donning brace   Lower Body Dressing Details (indicate cue type and reason): reviewed A/E, pts. spouse says they will have 24/7 assist from family and friends and/or CNA at home as she works f/t and will not be available to assist Toilet Transfer: Minimal assistance;+2 for safety/equipment;Cueing for safety;Cueing for sequencing;Stand-pivot;Ambulation;RW Toilet Transfer Details (indicate cue type and reason): simulated in room from eob to recliner with approx. 4-5 steps to recliner (cna present and assisted with pivotal steps to recliner) Toileting- Clothing Manipulation and Hygiene: Sit to/from stand Toileting - Clothing Manipulation Details (indicate cue type and reason): simulated during functional mobility in room     Functional mobility during ADLs: Minimal assistance;+2 for physical assistance;+2 for safety/equipment;Rolling walker General ADL Comments: wife present and reports "he will need some help at home regardless, and i work f/t so i wont be there"  states they are organizing family, friends, and possible CNA for home.  still encouraged her to  be hands on with learning how to assist with don/doff brace as she will need to be able to assist when she is home and also instruct other family members and friends.  she verbalized understanding but also remained very distant from physical interaction during session.  will benefit from more hands on at next session.        Vision                     Perception     Praxis      Cognition   Behavior During Therapy: Flat  affect Overall Cognitive Status: Difficult to assess                       Extremity/Trunk Assessment               Exercises     Shoulder Instructions       General Comments      Pertinent Vitals/ Pain       Pain Assessment: No/denies pain  Home Living                                          Prior Functioning/Environment              Frequency Min 2X/week     Progress Toward Goals  OT Goals(current goals can now be found in the care plan section)  Progress towards OT goals: Progressing toward goals     Plan Discharge plan remains appropriate    Co-evaluation                 End of Session Equipment Utilized During Treatment: Gait belt;Rolling walker;Back brace   Activity Tolerance Patient limited by lethargy   Patient Left in chair;with call bell/phone within reach;with family/visitor present   Nurse Communication          Time: 0865-7846 OT Time Calculation (min): 12 min  Charges: OT General Charges $OT Visit: 1 Procedure OT Treatments $Self Care/Home Management : 8-22 mins  Robet Leu, COTA/L 08/03/2015, 9:43 AM

## 2015-08-03 NOTE — Care Management (Signed)
Utilization review completed. Finnbar Cedillos, RN Case Manager [phone]. 

## 2015-08-03 NOTE — Care Management Note (Signed)
Case Management Note  Patient Details  Name: Earma ReadingKeith A Frix MRN: [id number] Date of Birth: 1961/06/11  Subjective/Objective:  54 yr old male s/p L4-5, L5-S1 anterior lumbar fusion.                  Action/Plan: Case manager spoke with patient and wife concerning home health needs at discharge. Choice was offered. [facility] called [facility]. Patient will have family support at discharge.  Expected Discharge Date:   08/04/15               Expected Discharge Plan:  Home w Home Health Services  In-House Referral:  NA  Discharge planning Services  CM Consult  Post Acute Care Choice:  Home Health, Durable Medical Equipment Choice offered to:  Patient, Spouse  DME Arranged:  3-N-1, Walker rolling DME Agency:  [facility]  HH Arranged:  PT HH Agency:  [facility]  Status of Service:  Completed, signed off  Medicare Important Message Given:    Date Medicare IM Given:    Medicare IM give by:    Date Additional Medicare IM Given:    Additional Medicare Important Message give by:     If discussed at Long Length of Stay Meetings, dates discussed:    Additional Comments:  Durenda GuthrieBrady, Shane Melby Naomi, RN 08/03/2015, 10:47 AM

## 2015-08-03 NOTE — Anesthesia Postprocedure Evaluation (Signed)
  Anesthesia Post-op Note  Patient: [name]  Procedure(s) Performed: Procedure(s) (LRB): ANTERIOR LUMBAR FUSION 2 LEVELS (N/A) POSTERIOR LUMBAR FUSION 2 LEVEL (N/A) ABDOMINAL EXPOSURE (N/A)  Patient Location: PACU  Anesthesia Type: General  Level of Consciousness: awake and alert   Airway and Oxygen Therapy: Patient Spontanous Breathing  Post-op Pain: mild  Post-op Assessment: Post-op Vital signs reviewed, Patient's Cardiovascular Status Stable, Respiratory Function Stable, Patent Airway and No signs of Nausea or vomiting  Last Vitals:  Filed Vitals:   08/02/15 2028  BP: 105/65  Pulse: 91  Temp: 37 C  Resp: 18    Post-op Vital Signs: stable   Complications: No apparent anesthesia complications

## 2015-08-04 NOTE — Discharge Summary (Signed)
Patient ID: [name] MRN: [id number] DOB/AGE: October 02, 1961 55 y.o.  Admit date: 08/01/2015 Discharge date: 08/04/2015  Admission Diagnoses:  Active Problems:   Radiculopathy   Discharge Diagnoses:  Same  Past Medical History  Diagnosis Date  . Hypertension   . Hyperlipidemia   . Gastroesophageal reflux disease   . Lumbar radiculopathy   . Hypermetropia   . Presbyopia   . Allergic rhinitis   . Lumbar radiculopathy 03-12-2015  . Back pain with radiation 08-04-2014  . Lumbosacral neuritis 05-02-2013  . Depression   . Anxiety   . History of hiatal hernia   . Osteoarthritis     DDD- lumbar  . DJD (degenerative joint disease)     Surgeries: Procedure(s): ANTERIOR LUMBAR FUSION 2 LEVELS POSTERIOR LUMBAR FUSION 2 LEVEL ABDOMINAL EXPOSURE on 08/01/2015   Consultants:    Discharged Condition: Improved  Hospital Course: [name] is an 54 y.o. male who was admitted 08/01/2015 for operative treatment of<principal problem not specified>. Patient has severe unremitting pain that affects sleep, daily activities, and work/hobbies. After pre-op clearance the patient was taken to the operating room on 08/01/2015 and underwent  Procedure(s): ANTERIOR LUMBAR FUSION 2 LEVELS POSTERIOR LUMBAR FUSION 2 LEVEL ABDOMINAL EXPOSURE.    Patient was given perioperative antibiotics: Anti-infectives    Start     Dose/Rate Route Frequency Ordered Stop   08/02/15 0000  ceFAZolin (ANCEF) IVPB 1 g/50 mL premix     1 g 100 mL/hr over 30 Minutes Intravenous Every 8 hours 08/01/15 2059 08/02/15 0938   08/01/15 0815  ceFAZolin (ANCEF) IVPB 2 g/50 mL premix     2 g 100 mL/hr over 30 Minutes Intravenous To [facility]Stay [facility] 07/31/15 0846 08/01/15 1702       Patient was given sequential compression devices, early ambulation, and chemoprophylaxis to prevent DVT.  Patient benefited maximally from hospital stay and there were no complications.    Recent vital signs: Patient Vitals  for the past 24 hrs:  BP Temp Temp src Pulse Resp SpO2  08/04/15 0542 (!) 101/55 mmHg 99.4 F (37.4 C) Oral (!) 103 20 99 %  08/04/15 0001 118/68 mmHg 99.2 F (37.3 C) Oral 93 20 93 %  08/03/15 2035 (!) 104/55 mmHg 98 F (36.7 C) Oral 92 20 96 %  08/03/15 1300 (!) 106/51 mmHg 99.1 F (37.3 C) Oral 98 20 94 %  08/03/15 1033 (!) 106/57 mmHg - - - - -  08/03/15 1032 (!) 106/57 mmHg - - - - -     Recent laboratory studies:  Recent Labs  08/02/15 0835  WBC 13.2*  HGB 9.7*  HCT 30.0*  PLT 137*     Discharge Medications:     Medication List    TAKE these medications        DEXILANT 60 MG capsule  Generic drug:  dexlansoprazole  Take 60 mg by mouth daily before breakfast.     LEVITRA PO  Take 20 mg by mouth daily as needed.     lisinopril 20 MG tablet  Commonly known as:  PRINIVIL,ZESTRIL  Take 20 mg by mouth daily.     methocarbamol 500 MG tablet  Commonly known as:  ROBAXIN  Take 500 mg by mouth 2 (two) times daily as needed for muscle spasms.     metoprolol succinate 50 MG 24 hr tablet  Commonly known as:  TOPROL-XL  Take 50 mg by mouth daily. Take with or immediately following a meal.     omeprazole 20  MG capsule  Commonly known as:  PRILOSEC  Take 20 mg by mouth 2 (two) times daily as needed. OTC     PARoxetine 10 MG tablet  Commonly known as:  PAXIL  Take 10 mg by mouth daily before breakfast.     spironolactone 25 MG tablet  Commonly known as:  ALDACTONE  Take 25 mg by mouth daily.     TRIBENZOR 40-10-25 MG Tabs  Generic drug:  Olmesartan-Amlodipine-HCTZ  Take 1 tablet by mouth daily.        Diagnostic Studies: Dg Chest 2 View  07/24/2015  CLINICAL DATA:  Hypertension.  Prior smoker. EXAM: CHEST  2 VIEW COMPARISON:  07/31/2009 . FINDINGS: Mediastinum and hilar structures normal. Lungs are clear. No pleural effusion pneumothorax. Heart size normal. Diffuse degenerative change thoracic spine. IMPRESSION: No acute cardiopulmonary disease.  Electronically Signed   By: Maisie Fus  Register   On: 07/24/2015 13:32   Dg Lumbar Spine 2-3 Views  08/01/2015  CLINICAL DATA:  Fusion L4-S1 EXAM: LUMBAR SPINE - 2-3 VIEW COMPARISON:  MRI 08/29/2014. FINDINGS: First intraoperative spot image demonstrates the localize instrument overlying the L5 vertebral body. Subsequent imaging demonstrate changes of anterior fusion at L4-5 and L5-S1. IMPRESSION: Intraoperative imaging as above. Electronically Signed   By: Charlett Nose M.D.   On: 08/01/2015 18:21   Dg Lumbar Spine Complete  08/01/2015  CLINICAL DATA:  Lumbar disc disease. EXAM: LUMBAR SPINE - COMPLETE 4+ VIEW; DG C-ARM GT 120 MIN COMPARISON:  MRI dated 08/29/2014 FINDINGS: Four C-arm images demonstrate the patient undergoing anterior interbody fusion at L4-5 and L5-S1. On image 7 the hardware appears in good position. IMPRESSION: Anterior fusions performed at L4-5 and L5-S1. Electronically Signed   By: Francene Boyers M.D.   On: 08/01/2015 18:25   Dg C-arm Gt 120 Min  08/01/2015  CLINICAL DATA:  Lumbar disc disease. EXAM: LUMBAR SPINE - COMPLETE 4+ VIEW; DG C-ARM GT 120 MIN COMPARISON:  MRI dated 08/29/2014 FINDINGS: Four C-arm images demonstrate the patient undergoing anterior interbody fusion at L4-5 and L5-S1. On image 7 the hardware appears in good position. IMPRESSION: Anterior fusions performed at L4-5 and L5-S1. Electronically Signed   By: Francene Boyers M.D.   On: 08/01/2015 18:25   Dg Or Local Abdomen  08/01/2015  CLINICAL DATA:  Checking for retained instruments after anterior lumbar fusion. EXAM: OR LOCAL ABDOMEN COMPARISON:  None. FINDINGS: There are no retained instruments after anterior lumbar fusion. Fusion hardware is in place. Surgical staples in place. Bowel gas pattern is normal. IMPRESSION: Benign appearing abdomen.  No retained instruments. Electronically Signed   By: Francene Boyers M.D.   On: 08/01/2015 15:30    Disposition:       Discharge Instructions    Call MD / Call  911    Complete by:  As directed   If you experience chest pain or shortness of breath, CALL 911 and be transported to the hospital emergency room.  If you develope a fever above 101 F, pus (white drainage) or increased drainage or redness at the wound, or calf pain, call your surgeon's office.     Constipation Prevention    Complete by:  As directed   Drink plenty of fluids.  Prune juice may be helpful.  You may use a stool softener, such as Colace (over the counter) 100 mg twice a day.  Use MiraLax (over the counter) for constipation as needed.     Diet - low sodium heart healthy  Complete by:  As directed      Increase activity slowly as tolerated    Complete by:  As directed            Follow-up Information    Follow up with [facility].   Why:  Someone from [facility] will contact you concerning start date and time for therapy.   Contact information:   [address] [place] [id number] [phone]        Signed: Drema HalonIDA, Madalina Rosman PAUL 08/04/2015, 7:15 AM

## 2015-08-04 NOTE — Progress Notes (Signed)
Physical Therapy Treatment Patient Details Name: Brandon ReadingKeith A Stewart MRN: [id number] DOB: [birth year] Today's Date: 08/04/2015    History of Present Illness 54 y.o. male [facility] for elective anterior and posterior lumbar fusion L4/5, L5-S1.  Pt with significant PMHx ofHTN, depression/anxiety, and multiple back surgeries.      PT Comments    Pt is progressing slowly, but daily with his independence and mobility.  He was able to be one person min assist on stairs today simulating home entry.  He does continue to fatigue quickly as shown by decreased functional strength in left leg, but is able to compensate well with bil arms supported by RW. Pt due to d/c home today.   Follow Up Recommendations  Home health PT;Supervision for mobility/OOB     Equipment Recommendations  Rolling walker with 5" wheels;3in1 (PT)    Recommendations for Other Services   NA     Precautions / Restrictions Precautions Precautions: Back;Fall Precaution Comments: verbally reviewed back precautions.  Required Braces or Orthoses: Spinal Brace Spinal Brace: Thoracolumbosacral orthotic;Other (comment) Spinal Brace Comments: donned in sitting per pt report Restrictions Weight Bearing Restrictions: No    Mobility  Bed Mobility               General bed mobility comments: pt is seated in recliner chair.  Worked with OT earlier this AM.   Transfers Overall transfer level: Needs assistance Equipment used: Rolling walker (2 wheeled) Transfers: Sit to/from Stand Sit to Stand: Supervision Stand pivot transfers: Min assist       General transfer comment: supervision for safety due to slow speed of transitions, verbal cues to unlock brace when going to sit.  Pt with heavy reliance on arms for support and stood over flexed knees.   Ambulation/Gait Ambulation/Gait assistance: Min guard Ambulation Distance (Feet): 120 Feet Assistive device: Rolling walker (2 wheeled) Gait  Pattern/deviations: Step-through pattern;Decreased dorsiflexion - left;Decreased stance time - left;Shuffle Gait velocity: decreased Gait velocity interpretation: Below normal speed for age/gender General Gait Details: Pt with improved foot clearance on left today, although, still noticeably diffierent than right.  As pt fatigues he buckles/hyperextends left knee for stability and leans more heavily on upper extremities.    Stairs Stairs: Yes Stairs assistance: Min assist Stair Management: Two rails;Forwards;Step to pattern Number of Stairs: 5 (x2) General stair comments: Verbal cues for safest LE sequencing given weakness and brace lock out.  Min assist to support trunk as he powers up over weak legs.          Balance Overall balance assessment: Needs assistance Sitting-balance support: Feet supported;No upper extremity supported Sitting balance-Leahy Scale: Fair     Standing balance support: Bilateral upper extremity supported Standing balance-Leahy Scale: Poor Standing balance comment: RW for support                    Cognition Arousal/Alertness: Awake/alert Behavior During Therapy: WFL for tasks assessed/performed Overall Cognitive Status: Within Functional Limits for tasks assessed                             Pertinent Vitals/Pain Pain Assessment: 0-10 Faces Pain Scale: Hurts little more Pain Location: incisional and right leg burning.  Pain Descriptors / Indicators: Aching;Constant;Burning Pain Intervention(s): Limited activity within patient's tolerance;Monitored during session;Repositioned           PT Goals (current goals can now be found in the care plan section) Acute Rehab PT  Goals Patient Stated Goal: to get better/stronger, reduce pain Progress towards PT goals: Progressing toward goals    Frequency  Min 5X/week    PT Plan Current plan remains appropriate       End of Session Equipment Utilized During Treatment: Back  brace Activity Tolerance: Patient limited by fatigue;Patient limited by pain Patient left: in bed;with call bell/phone within reach;with family/visitor present (seated EOB gettinf ready for d/c with wife/sister)     Time: 4098-1191 PT Time Calculation (min) (ACUTE ONLY): 14 min  Charges:  $Gait Training: 8-22 mins                      Ciani Rutten B. Bevely Hackbart, PT, DPT [phone]   08/04/2015, 10:39 AM

## 2015-08-04 NOTE — Progress Notes (Signed)
Occupational Therapy Treatment Patient Details Name: Earma ReadingKeith A Westling MRN: [id number] DOB: 26-May-1961 Today's Date: 08/04/2015    History of present illness 54 y.o. male [facility] for elective anterior and posterior lumbar fusion L4/5, L5-S1.  Pt with significant PMHx ofHTN, depression/anxiety, and multiple back surgeries.     OT comments  Pt participated in ADL retraining session today for pt/family education on don/doff brace (they continue to require min-mod vc's for positioning/assist but state that they do not have any other questions or concerns at this time) family member in room remained very distant.He was Min A for chair, toilet and 3;1 functional transfers at this time w/ min vc's for adhering to back precautions and completed ADL's at sink level today. LB bathe/dressing Min A. Pt plans to d/c home lateer today w/ PRN family assist , will sign off acute OT at this time.  Follow Up Recommendations  No OT follow up;Supervision - Intermittent    Equipment Recommendations  3 in 1 bedside comode;Other (comment)    Recommendations for Other Services      Precautions / Restrictions Precautions Precautions: Back;Fall Required Braces or Orthoses: Spinal Brace Spinal Brace: Thoracolumbosacral orthotic;Other (comment) Spinal Brace Comments: donned in sitting per pt report Restrictions Weight Bearing Restrictions: No       Mobility Bed Mobility               General bed mobility comments: seated in recliner chair  Transfers Overall transfer level: Needs assistance Equipment used: Rolling walker (2 wheeled) Transfers: Sit to/from [employer] CorporationStand;Stand Pivot Transfers Sit to Stand: Min assist Stand pivot transfers: Min assist       General transfer comment: Pt able to lock and unlock brace during treatment session today during transfers w/o vc's. Ocassional cues to push from surface (chair, 3:1 and toilet instead of pushing up on RW.    Balance Overall  balance assessment: Needs assistance Sitting-balance support: No upper extremity supported;Feet supported Sitting balance-Leahy Scale: Fair     Standing balance support: Bilateral upper extremity supported Standing balance-Leahy Scale: Fair Standing balance comment: RW for support                   ADL Overall ADL's : Needs assistance/impaired     Grooming: Wash/dry hands;Wash/dry face;Oral care;Sitting;Set up;Supervision/safety   Upper Body Bathing: Supervision/ safety;Sitting;Set up;Minimal assitance (Min Assist to wash back)   Lower Body Bathing: Minimal assistance;Adhering to back precautions;Sit to/from stand Lower Body Bathing Details (indicate cue type and reason): Min assist w/ min-mod verbal cues to adhere to back precautions Upper Body Dressing : Set up;Sitting;Supervision/safety Upper Body Dressing Details (indicate cue type and reason): Mod assist to don brace; Pt and family education provided today - they verbalize that they do not have any questions re:don/doff after instruction Lower Body Dressing: Minimal assistance;Sit to/from stand;Adhering to back precautions Lower Body Dressing Details (indicate cue type and reason): Pt reports that family/wife will assist with all LB ADL's discussed LH reacher PRN Toilet Transfer: Minimal assistance;RW;Ambulation;Cueing for safety;Stand-pivot   Toileting- Clothing Manipulation and Hygiene: Sit to/from stand;Supervision/safety   Tub/ Engineer, structuralhower Transfer:  (Pt states that he will sponge bathe initially at discharge)   Functional mobility during ADLs: Minimal assistance;Cueing for sequencing;Rolling walker General ADL Comments: Pt participated in ADL retraining session today for pt/family education on don/doff brace (they continue to require min-mod vc's for positioning/assist but state that they do not have any other questions or concerns at this time.He was Min A for [employer]ar,  toilet and functional transfers at this time w/ vc's for  adhering to back precautions and completed ADL's at sink level today. LB bathe/dressing Min A. Pt plans to d/c home lateer today w/ PRN family assist , will sign off acute OT at this time.      Vision  No change from baseline                   Perception     Praxis      Cognition   Behavior During Therapy: WFL for tasks assessed/performed Overall Cognitive Status: Difficult to assess                       Extremity/Trunk Assessment               Exercises     Shoulder Instructions       General Comments  Pt/family education on don/doff brace and back precautions    Pertinent Vitals/ Pain       Pain Assessment: 0-10 Faces Pain Scale: Hurts little more Pain Location: left leg numbness Pain Descriptors / Indicators: Numbness;Aching Pain Intervention(s): Limited activity within patient's tolerance;Monitored during session;Repositioned  Home Living                                          Prior Functioning/Environment              Frequency Min 2X/week     Progress Toward Goals  OT Goals(current goals can now be found in the care plan section)  Progress towards OT goals: Progressing toward goals  Acute Rehab OT Goals Patient Stated Goal: to get better/stronger, reduce pain OT Goal Formulation: With patient Time For Goal Achievement: 08/16/15 Potential to Achieve Goals: Good  Plan Discharge plan remains appropriate    Co-evaluation                 End of Session Equipment Utilized During Treatment: Gait belt;Rolling walker;Back brace   Activity Tolerance Patient tolerated treatment well   Patient Left in chair;with call bell/phone within reach;with family/visitor present   Nurse Communication          Time: 1610-9604 OT Time Calculation (min): 35 min  Charges: OT General Charges $OT Visit: 1 Procedure OT Treatments $Self Care/Home Management : 23-37 mins  [place], Amy Beth Dixon,  OTR/L 08/04/2015, 8:34 AM

## 2015-08-04 NOTE — Progress Notes (Signed)
Subjective: 3 Days Post-Op Procedure(s) (LRB): ANTERIOR LUMBAR FUSION 2 LEVELS (N/A) POSTERIOR LUMBAR FUSION 2 LEVEL (N/A) ABDOMINAL EXPOSURE (N/A)   Patient is resting comfortably in the chair this morning. Sister is with him. He states that his radiculopathy is slowly resolving.  Activity level:  wbat Diet tolerance:  ok Voiding:  ok Patient reports pain as mild.    Objective: Vital signs in last 24 hours: Temp:  [98 F (36.7 C)-99.4 F (37.4 C)] 99.4 F (37.4 C) (10/15 0542) Pulse Rate:  [92-103] 103 (10/15 0542) Resp:  [20] 20 (10/15 0542) BP: (101-118)/(51-68) 101/55 mmHg (10/15 0542) SpO2:  [93 %-99 %] 99 % (10/15 0542)  Labs:  Recent Labs  08/02/15 0835  HGB 9.7*    Recent Labs  08/02/15 0835  WBC 13.2*  RBC 3.42*  HCT 30.0*  PLT 137*   No results for input(s): NA, K, CL, CO2, BUN, CREATININE, GLUCOSE, CALCIUM in the last 72 hours. No results for input(s): LABPT, INR in the last 72 hours.  Physical Exam:  Neurologically intact ABD soft Neurovascular intact Sensation intact distally Intact pulses distally Dorsiflexion/Plantar flexion intact Incision: dressing C/D/I and no drainage No cellulitis present Compartment soft  Assessment/Plan:  3 Days Post-Op Procedure(s) (LRB): ANTERIOR LUMBAR FUSION 2 LEVELS (N/A) POSTERIOR LUMBAR FUSION 2 LEVEL (N/A) ABDOMINAL EXPOSURE (N/A) Advance diet Up with therapy Discharge home with home health today after PT. Follow up with Dr. Yevette Edwardsumonski in office as scheduled. Percocet and valium for pain and spasm.   Kischa Altice, Ginger OrganNDREW PAUL 08/04/2015, 7:11 AM

## 2015-09-04 ENCOUNTER — Telehealth: Payer: Self-pay

## 2015-09-04 DIAGNOSIS — M79605 Pain in left leg: Principal | ICD-10-CM

## 2015-09-04 DIAGNOSIS — M7989 Other specified soft tissue disorders: Secondary | ICD-10-CM

## 2015-09-04 DIAGNOSIS — M79604 Pain in right leg: Secondary | ICD-10-CM

## 2015-09-04 NOTE — Telephone Encounter (Signed)
Phone call from pt.  Reported he had back surgery on 08/01/15, and is having intermittent burning sensation in bilat. Legs, from waist to knees, and from knees to ankles; (L) > (R).  Also stated there is a "spot" on left inner thigh that is more intense, when the burning starts.   Reported he had a burning and weakness in left leg prior to surgery.  Stated he has burning in legs if he stands for a length of time, and this eases up after her walks around.  Stated he has worn thigh high compression hose intermittently, and the stockings help the burning.  Also, stated that elevating helps the burning sensation.  Denied any open sores of lower extremities.  Denied rest pain.  Stated has family hx of PVD, and that Dr. Yevette Edwardsumonski wanted him to be referred to Dr. Arbie CookeyEarly for further evaluation.  Will discuss with Dr. Arbie CookeyEarly re: vascular studies needed, and will return call with appt.

## 2015-09-04 NOTE — Telephone Encounter (Signed)
Spoke with pt to schedule, dpm °

## 2015-09-04 NOTE — Telephone Encounter (Signed)
Discussed pt. with Dr. Arbie CookeyEarly.  Recommended to schedule pt. For bilateral venous duplex to r/o DVT.  Pt. Will be notified of appt.

## 2015-09-05 ENCOUNTER — Ambulatory Visit (HOSPITAL_COMMUNITY)
Admission: RE | Admit: 2015-09-05 | Discharge: 2015-09-05 | Disposition: A | Discharge status: Home / Self Care | Payer: Commercial Managed Care - HMO | Source: Ambulatory Visit | Attending: Vascular Surgery | Admitting: Vascular Surgery

## 2015-09-05 DIAGNOSIS — M79604 Pain in right leg: Secondary | ICD-10-CM | POA: Diagnosis not present

## 2015-09-05 DIAGNOSIS — M79605 Pain in left leg: Secondary | ICD-10-CM | POA: Diagnosis not present

## 2015-09-05 DIAGNOSIS — M7989 Other specified soft tissue disorders: Secondary | ICD-10-CM

## 2015-09-06 ENCOUNTER — Ambulatory Visit (INDEPENDENT_AMBULATORY_CARE_PROVIDER_SITE_OTHER): Payer: Commercial Managed Care - HMO | Admitting: Vascular Surgery

## 2015-09-06 ENCOUNTER — Encounter: Payer: Self-pay | Admitting: Vascular Surgery

## 2015-09-06 VITALS — BP 138/91 | HR 84 | Temp 98.3°F | Resp 18 | Ht 69.0 in | Wt 250.0 lb

## 2015-09-06 DIAGNOSIS — M5137 Other intervertebral disc degeneration, lumbosacral region: Secondary | ICD-10-CM

## 2015-09-06 NOTE — Progress Notes (Signed)
   Patient name: [name] MRN: [id number] DOB: 1961-05-07 Sex: male  REASON FOR VISIT:  Postop follow-up  HPI: [name] is a 54 y.o. male  Underwent to level L4-5 and L5-S1 disc surgery with the anterior approach by myself and the spine fixation with Dr.Dumonski. He is seen today regarding lower extremity discomfort. He reports burning sensation in both lower extremities. He does report a specific area on the lateral aspect of his left knee which has a burning sensation as well. He reports mild swelling.  Current Outpatient Prescriptions  Medication Sig Dispense Refill  . dexlansoprazole (DEXILANT) 60 MG capsule Take 60 mg by mouth daily before breakfast.     . methocarbamol (ROBAXIN) 500 MG tablet Take 500 mg by mouth 2 (two) times daily as needed for muscle spasms.    . metoprolol (LOPRESSOR) 50 MG tablet Take 50 mg by mouth 2 (two) times daily.    . metoprolol succinate (TOPROL-XL) 50 MG 24 hr tablet Take 50 mg by mouth daily. Take with or immediately following a meal.    . spironolactone (ALDACTONE) 25 MG tablet Take 25 mg by mouth daily.    Marland Kitchen. lisinopril (PRINIVIL,ZESTRIL) 20 MG tablet Take 20 mg by mouth daily.    . Olmesartan-Amlodipine-HCTZ (TRIBENZOR) 40-10-25 MG TABS Take 1 tablet by mouth daily.     Marland Kitchen. omeprazole (PRILOSEC) 20 MG capsule Take 20 mg by mouth 2 (two) times daily as needed. OTC    . PARoxetine (PAXIL) 10 MG tablet Take 10 mg by mouth daily before breakfast.     . Vardenafil HCl (LEVITRA PO) Take 20 mg by mouth daily as needed.      No current facility-administered medications for this visit.    REVIEW OF SYSTEMS:  [X]  denotes positive finding, [ ]  denotes negative finding Cardiac  Comments:  Chest pain or chest pressure:    Shortness of breath upon exertion:    Short of breath when lying flat:    Irregular heart rhythm:    Constitutional    Fever or chills:      PHYSICAL EXAM: Filed Vitals:   09/06/15 1023  BP: 138/91  Pulse: 84  Temp: 98.3  F (36.8 C)  Resp: 18  Height: 5\' 9"  (1.753 m)  Weight: 250 lb (113.399 kg)  SpO2: 100%    GENERAL: The patient is a well-nourished male, in no acute distress.  He is in a spine brace  CARDIOVASCULAR:  2+ dorsalis pedis pulses bilaterally PULMONARY: There is good air exchange bilaterally   no significant swelling in his right or left leg.  MEDICAL ISSUES:  he underwent formal venous duplex in our office today shows no evidence of deep vein thrombosis or superficial thrombophlebitis in either lower extremity.   I discussed this at length with the patient and his wife and brother present. Explain that they does not have any evidence of venous or arterial injury. I am unable to explain the cause of his bilateral lower extremity burning. He is  Questioning if there are medications that are available to help with this. I will defer this to Dr.Dumonski. He will see us again on an as-needed basis  Xavier Fournier [facility] [place] [employer]: [phone]

## 2016-02-20 ENCOUNTER — Other Ambulatory Visit: Payer: Self-pay | Admitting: Orthopedic Surgery

## 2016-02-20 DIAGNOSIS — M545 Low back pain: Secondary | ICD-10-CM

## 2016-02-28 ENCOUNTER — Ambulatory Visit
Admission: RE | Admit: 2016-02-28 | Discharge: 2016-02-28 | Disposition: A | Discharge status: Home / Self Care | Payer: Commercial Managed Care - HMO | Source: Ambulatory Visit | Attending: Orthopedic Surgery | Admitting: Orthopedic Surgery

## 2016-02-28 DIAGNOSIS — M545 Low back pain: Secondary | ICD-10-CM

## 2017-10-21 DIAGNOSIS — M5136 Other intervertebral disc degeneration, lumbar region: Secondary | ICD-10-CM | POA: Diagnosis not present

## 2017-10-21 DIAGNOSIS — Z760 Encounter for issue of repeat prescription: Secondary | ICD-10-CM | POA: Diagnosis not present

## 2017-10-21 DIAGNOSIS — Z23 Encounter for immunization: Secondary | ICD-10-CM | POA: Diagnosis not present

## 2017-10-21 DIAGNOSIS — E1165 Type 2 diabetes mellitus with hyperglycemia: Secondary | ICD-10-CM | POA: Diagnosis not present

## 2017-10-26 DIAGNOSIS — L988 Other specified disorders of the skin and subcutaneous tissue: Secondary | ICD-10-CM | POA: Diagnosis not present

## 2017-11-02 DIAGNOSIS — R238 Other skin changes: Secondary | ICD-10-CM | POA: Diagnosis not present

## 2017-11-02 DIAGNOSIS — L989 Disorder of the skin and subcutaneous tissue, unspecified: Secondary | ICD-10-CM | POA: Diagnosis not present

## 2017-11-03 DIAGNOSIS — L308 Other specified dermatitis: Secondary | ICD-10-CM | POA: Diagnosis not present

## 2017-11-04 DIAGNOSIS — E119 Type 2 diabetes mellitus without complications: Secondary | ICD-10-CM | POA: Diagnosis not present

## 2017-11-04 DIAGNOSIS — M1990 Unspecified osteoarthritis, unspecified site: Secondary | ICD-10-CM | POA: Diagnosis not present

## 2017-11-04 DIAGNOSIS — E1140 Type 2 diabetes mellitus with diabetic neuropathy, unspecified: Secondary | ICD-10-CM | POA: Diagnosis not present

## 2017-11-04 DIAGNOSIS — M4800 Spinal stenosis, site unspecified: Secondary | ICD-10-CM | POA: Diagnosis not present

## 2017-11-04 DIAGNOSIS — E785 Hyperlipidemia, unspecified: Secondary | ICD-10-CM | POA: Diagnosis not present

## 2017-11-04 DIAGNOSIS — I10 Essential (primary) hypertension: Secondary | ICD-10-CM | POA: Diagnosis not present

## 2017-11-04 DIAGNOSIS — I2510 Atherosclerotic heart disease of native coronary artery without angina pectoris: Secondary | ICD-10-CM | POA: Diagnosis not present

## 2017-11-17 DIAGNOSIS — L280 Lichen simplex chronicus: Secondary | ICD-10-CM | POA: Diagnosis not present

## 2017-12-17 DIAGNOSIS — L280 Lichen simplex chronicus: Secondary | ICD-10-CM | POA: Diagnosis not present

## 2017-12-31 DIAGNOSIS — I2510 Atherosclerotic heart disease of native coronary artery without angina pectoris: Secondary | ICD-10-CM | POA: Diagnosis not present

## 2017-12-31 DIAGNOSIS — E119 Type 2 diabetes mellitus without complications: Secondary | ICD-10-CM | POA: Diagnosis not present

## 2017-12-31 DIAGNOSIS — I10 Essential (primary) hypertension: Secondary | ICD-10-CM | POA: Diagnosis not present

## 2017-12-31 DIAGNOSIS — E1140 Type 2 diabetes mellitus with diabetic neuropathy, unspecified: Secondary | ICD-10-CM | POA: Diagnosis not present

## 2017-12-31 DIAGNOSIS — E785 Hyperlipidemia, unspecified: Secondary | ICD-10-CM | POA: Diagnosis not present

## 2017-12-31 DIAGNOSIS — I739 Peripheral vascular disease, unspecified: Secondary | ICD-10-CM | POA: Diagnosis not present

## 2018-01-13 DIAGNOSIS — F411 Generalized anxiety disorder: Secondary | ICD-10-CM | POA: Diagnosis not present

## 2018-01-13 DIAGNOSIS — F329 Major depressive disorder, single episode, unspecified: Secondary | ICD-10-CM | POA: Diagnosis not present

## 2018-02-04 DIAGNOSIS — I10 Essential (primary) hypertension: Secondary | ICD-10-CM | POA: Diagnosis not present

## 2018-02-04 DIAGNOSIS — E782 Mixed hyperlipidemia: Secondary | ICD-10-CM | POA: Diagnosis not present

## 2018-02-04 DIAGNOSIS — E1165 Type 2 diabetes mellitus with hyperglycemia: Secondary | ICD-10-CM | POA: Diagnosis not present

## 2018-03-05 DIAGNOSIS — I7389 Other specified peripheral vascular diseases: Secondary | ICD-10-CM | POA: Diagnosis not present

## 2018-03-05 DIAGNOSIS — I6523 Occlusion and stenosis of bilateral carotid arteries: Secondary | ICD-10-CM | POA: Diagnosis not present

## 2018-04-07 DIAGNOSIS — I2510 Atherosclerotic heart disease of native coronary artery without angina pectoris: Secondary | ICD-10-CM | POA: Diagnosis not present

## 2018-04-07 DIAGNOSIS — I10 Essential (primary) hypertension: Secondary | ICD-10-CM | POA: Diagnosis not present

## 2018-04-07 DIAGNOSIS — I739 Peripheral vascular disease, unspecified: Secondary | ICD-10-CM | POA: Diagnosis not present

## 2018-04-07 DIAGNOSIS — E1140 Type 2 diabetes mellitus with diabetic neuropathy, unspecified: Secondary | ICD-10-CM | POA: Diagnosis not present

## 2018-04-07 DIAGNOSIS — E119 Type 2 diabetes mellitus without complications: Secondary | ICD-10-CM | POA: Diagnosis not present

## 2018-04-07 DIAGNOSIS — E785 Hyperlipidemia, unspecified: Secondary | ICD-10-CM | POA: Diagnosis not present

## 2018-06-10 DIAGNOSIS — E1165 Type 2 diabetes mellitus with hyperglycemia: Secondary | ICD-10-CM | POA: Diagnosis not present

## 2018-06-10 DIAGNOSIS — E559 Vitamin D deficiency, unspecified: Secondary | ICD-10-CM | POA: Diagnosis not present

## 2018-06-10 DIAGNOSIS — Z0000 Encounter for general adult medical examination without abnormal findings: Secondary | ICD-10-CM | POA: Diagnosis not present

## 2018-06-10 DIAGNOSIS — Z125 Encounter for screening for malignant neoplasm of prostate: Secondary | ICD-10-CM | POA: Diagnosis not present

## 2018-06-10 DIAGNOSIS — I10 Essential (primary) hypertension: Secondary | ICD-10-CM | POA: Diagnosis not present

## 2018-06-10 DIAGNOSIS — Z79899 Other long term (current) drug therapy: Secondary | ICD-10-CM | POA: Diagnosis not present

## 2018-06-10 DIAGNOSIS — E782 Mixed hyperlipidemia: Secondary | ICD-10-CM | POA: Diagnosis not present

## 2018-07-09 DIAGNOSIS — I2510 Atherosclerotic heart disease of native coronary artery without angina pectoris: Secondary | ICD-10-CM | POA: Diagnosis not present

## 2018-07-09 DIAGNOSIS — E559 Vitamin D deficiency, unspecified: Secondary | ICD-10-CM | POA: Diagnosis not present

## 2018-07-09 DIAGNOSIS — E785 Hyperlipidemia, unspecified: Secondary | ICD-10-CM | POA: Diagnosis not present

## 2018-07-09 DIAGNOSIS — I739 Peripheral vascular disease, unspecified: Secondary | ICD-10-CM | POA: Diagnosis not present

## 2018-07-09 DIAGNOSIS — I10 Essential (primary) hypertension: Secondary | ICD-10-CM | POA: Diagnosis not present

## 2018-07-09 DIAGNOSIS — E1140 Type 2 diabetes mellitus with diabetic neuropathy, unspecified: Secondary | ICD-10-CM | POA: Diagnosis not present

## 2018-08-05 DIAGNOSIS — F411 Generalized anxiety disorder: Secondary | ICD-10-CM | POA: Diagnosis not present

## 2018-08-05 DIAGNOSIS — F329 Major depressive disorder, single episode, unspecified: Secondary | ICD-10-CM | POA: Diagnosis not present

## 2018-08-07 DIAGNOSIS — Z23 Encounter for immunization: Secondary | ICD-10-CM | POA: Diagnosis not present

## 2018-09-23 DIAGNOSIS — I10 Essential (primary) hypertension: Secondary | ICD-10-CM | POA: Diagnosis not present

## 2018-09-23 DIAGNOSIS — B354 Tinea corporis: Secondary | ICD-10-CM | POA: Diagnosis not present

## 2018-09-23 DIAGNOSIS — E782 Mixed hyperlipidemia: Secondary | ICD-10-CM | POA: Diagnosis not present

## 2018-09-23 DIAGNOSIS — E1165 Type 2 diabetes mellitus with hyperglycemia: Secondary | ICD-10-CM | POA: Diagnosis not present

## 2018-10-04 DIAGNOSIS — I10 Essential (primary) hypertension: Secondary | ICD-10-CM | POA: Diagnosis not present

## 2018-10-08 DIAGNOSIS — E785 Hyperlipidemia, unspecified: Secondary | ICD-10-CM | POA: Diagnosis not present

## 2018-10-08 DIAGNOSIS — I2510 Atherosclerotic heart disease of native coronary artery without angina pectoris: Secondary | ICD-10-CM | POA: Diagnosis not present

## 2018-10-08 DIAGNOSIS — I10 Essential (primary) hypertension: Secondary | ICD-10-CM | POA: Diagnosis not present

## 2018-10-08 DIAGNOSIS — E1140 Type 2 diabetes mellitus with diabetic neuropathy, unspecified: Secondary | ICD-10-CM | POA: Diagnosis not present

## 2018-10-08 DIAGNOSIS — I739 Peripheral vascular disease, unspecified: Secondary | ICD-10-CM | POA: Diagnosis not present

## 2018-10-08 DIAGNOSIS — M1990 Unspecified osteoarthritis, unspecified site: Secondary | ICD-10-CM | POA: Diagnosis not present

## 2018-12-30 DIAGNOSIS — I10 Essential (primary) hypertension: Secondary | ICD-10-CM | POA: Diagnosis not present

## 2018-12-30 DIAGNOSIS — E782 Mixed hyperlipidemia: Secondary | ICD-10-CM | POA: Diagnosis not present

## 2018-12-30 DIAGNOSIS — E1165 Type 2 diabetes mellitus with hyperglycemia: Secondary | ICD-10-CM | POA: Diagnosis not present

## 2018-12-30 DIAGNOSIS — R209 Unspecified disturbances of skin sensation: Secondary | ICD-10-CM | POA: Diagnosis not present

## 2019-01-07 DIAGNOSIS — M1990 Unspecified osteoarthritis, unspecified site: Secondary | ICD-10-CM | POA: Diagnosis not present

## 2019-01-07 DIAGNOSIS — I739 Peripheral vascular disease, unspecified: Secondary | ICD-10-CM | POA: Diagnosis not present

## 2019-01-07 DIAGNOSIS — I10 Essential (primary) hypertension: Secondary | ICD-10-CM | POA: Diagnosis not present

## 2019-01-07 DIAGNOSIS — I2510 Atherosclerotic heart disease of native coronary artery without angina pectoris: Secondary | ICD-10-CM | POA: Diagnosis not present

## 2019-01-07 DIAGNOSIS — E1140 Type 2 diabetes mellitus with diabetic neuropathy, unspecified: Secondary | ICD-10-CM | POA: Diagnosis not present

## 2019-01-07 DIAGNOSIS — E785 Hyperlipidemia, unspecified: Secondary | ICD-10-CM | POA: Diagnosis not present

## 2019-01-27 DIAGNOSIS — F411 Generalized anxiety disorder: Secondary | ICD-10-CM | POA: Diagnosis not present

## 2019-01-27 DIAGNOSIS — F329 Major depressive disorder, single episode, unspecified: Secondary | ICD-10-CM | POA: Diagnosis not present

## 2019-03-11 DIAGNOSIS — I739 Peripheral vascular disease, unspecified: Secondary | ICD-10-CM | POA: Diagnosis not present

## 2019-03-11 DIAGNOSIS — I6523 Occlusion and stenosis of bilateral carotid arteries: Secondary | ICD-10-CM | POA: Diagnosis not present

## 2019-04-06 DIAGNOSIS — Z79899 Other long term (current) drug therapy: Secondary | ICD-10-CM | POA: Diagnosis not present

## 2019-04-06 DIAGNOSIS — E782 Mixed hyperlipidemia: Secondary | ICD-10-CM | POA: Diagnosis not present

## 2019-04-06 DIAGNOSIS — Z1159 Encounter for screening for other viral diseases: Secondary | ICD-10-CM | POA: Diagnosis not present

## 2019-04-06 DIAGNOSIS — E1165 Type 2 diabetes mellitus with hyperglycemia: Secondary | ICD-10-CM | POA: Diagnosis not present

## 2019-04-12 DIAGNOSIS — L280 Lichen simplex chronicus: Secondary | ICD-10-CM | POA: Diagnosis not present

## 2019-04-15 DIAGNOSIS — M1990 Unspecified osteoarthritis, unspecified site: Secondary | ICD-10-CM | POA: Diagnosis not present

## 2019-04-15 DIAGNOSIS — R0789 Other chest pain: Secondary | ICD-10-CM | POA: Diagnosis not present

## 2019-04-15 DIAGNOSIS — E785 Hyperlipidemia, unspecified: Secondary | ICD-10-CM | POA: Diagnosis not present

## 2019-04-15 DIAGNOSIS — I2510 Atherosclerotic heart disease of native coronary artery without angina pectoris: Secondary | ICD-10-CM | POA: Diagnosis not present

## 2019-04-15 DIAGNOSIS — I739 Peripheral vascular disease, unspecified: Secondary | ICD-10-CM | POA: Diagnosis not present

## 2019-04-15 DIAGNOSIS — E1140 Type 2 diabetes mellitus with diabetic neuropathy, unspecified: Secondary | ICD-10-CM | POA: Diagnosis not present

## 2019-04-15 DIAGNOSIS — I10 Essential (primary) hypertension: Secondary | ICD-10-CM | POA: Diagnosis not present

## 2019-05-03 DIAGNOSIS — R109 Unspecified abdominal pain: Secondary | ICD-10-CM | POA: Diagnosis not present

## 2019-05-03 DIAGNOSIS — R112 Nausea with vomiting, unspecified: Secondary | ICD-10-CM | POA: Diagnosis not present

## 2019-05-03 DIAGNOSIS — N132 Hydronephrosis with renal and ureteral calculous obstruction: Secondary | ICD-10-CM | POA: Diagnosis not present

## 2019-05-03 DIAGNOSIS — N200 Calculus of kidney: Secondary | ICD-10-CM | POA: Diagnosis not present

## 2019-05-03 DIAGNOSIS — D72829 Elevated white blood cell count, unspecified: Secondary | ICD-10-CM | POA: Diagnosis not present

## 2019-05-05 DIAGNOSIS — N201 Calculus of ureter: Secondary | ICD-10-CM | POA: Diagnosis not present

## 2019-05-06 DIAGNOSIS — N201 Calculus of ureter: Secondary | ICD-10-CM | POA: Diagnosis not present

## 2019-05-09 DIAGNOSIS — N201 Calculus of ureter: Secondary | ICD-10-CM | POA: Diagnosis not present

## 2019-05-16 DIAGNOSIS — N201 Calculus of ureter: Secondary | ICD-10-CM | POA: Diagnosis not present

## 2019-07-15 DIAGNOSIS — I2510 Atherosclerotic heart disease of native coronary artery without angina pectoris: Secondary | ICD-10-CM | POA: Diagnosis not present

## 2019-07-15 DIAGNOSIS — E785 Hyperlipidemia, unspecified: Secondary | ICD-10-CM | POA: Diagnosis not present

## 2019-07-15 DIAGNOSIS — I10 Essential (primary) hypertension: Secondary | ICD-10-CM | POA: Diagnosis not present

## 2019-07-15 DIAGNOSIS — E119 Type 2 diabetes mellitus without complications: Secondary | ICD-10-CM | POA: Diagnosis not present

## 2019-07-15 DIAGNOSIS — F341 Dysthymic disorder: Secondary | ICD-10-CM | POA: Diagnosis not present

## 2019-07-15 DIAGNOSIS — I739 Peripheral vascular disease, unspecified: Secondary | ICD-10-CM | POA: Diagnosis not present

## 2019-07-15 DIAGNOSIS — E1140 Type 2 diabetes mellitus with diabetic neuropathy, unspecified: Secondary | ICD-10-CM | POA: Diagnosis not present

## 2019-07-15 DIAGNOSIS — M1990 Unspecified osteoarthritis, unspecified site: Secondary | ICD-10-CM | POA: Diagnosis not present

## 2019-07-20 DIAGNOSIS — Z1159 Encounter for screening for other viral diseases: Secondary | ICD-10-CM | POA: Diagnosis not present

## 2019-07-20 DIAGNOSIS — Z23 Encounter for immunization: Secondary | ICD-10-CM | POA: Diagnosis not present

## 2019-07-20 DIAGNOSIS — E1165 Type 2 diabetes mellitus with hyperglycemia: Secondary | ICD-10-CM | POA: Diagnosis not present

## 2019-07-20 DIAGNOSIS — I10 Essential (primary) hypertension: Secondary | ICD-10-CM | POA: Diagnosis not present

## 2019-07-20 DIAGNOSIS — E782 Mixed hyperlipidemia: Secondary | ICD-10-CM | POA: Diagnosis not present

## 2019-07-22 DIAGNOSIS — F411 Generalized anxiety disorder: Secondary | ICD-10-CM | POA: Diagnosis not present

## 2019-07-22 DIAGNOSIS — F329 Major depressive disorder, single episode, unspecified: Secondary | ICD-10-CM | POA: Diagnosis not present

## 2019-08-16 DIAGNOSIS — N201 Calculus of ureter: Secondary | ICD-10-CM | POA: Diagnosis not present

## 2019-08-16 DIAGNOSIS — N401 Enlarged prostate with lower urinary tract symptoms: Secondary | ICD-10-CM | POA: Diagnosis not present

## 2019-08-16 DIAGNOSIS — N138 Other obstructive and reflux uropathy: Secondary | ICD-10-CM | POA: Diagnosis not present

## 2019-08-16 DIAGNOSIS — R339 Retention of urine, unspecified: Secondary | ICD-10-CM | POA: Diagnosis not present

## 2019-11-03 DIAGNOSIS — Z125 Encounter for screening for malignant neoplasm of prostate: Secondary | ICD-10-CM | POA: Diagnosis not present

## 2019-11-03 DIAGNOSIS — E1165 Type 2 diabetes mellitus with hyperglycemia: Secondary | ICD-10-CM | POA: Diagnosis not present

## 2019-11-03 DIAGNOSIS — R0602 Shortness of breath: Secondary | ICD-10-CM | POA: Diagnosis not present

## 2019-11-03 DIAGNOSIS — R635 Abnormal weight gain: Secondary | ICD-10-CM | POA: Diagnosis not present

## 2019-11-03 DIAGNOSIS — Z79899 Other long term (current) drug therapy: Secondary | ICD-10-CM | POA: Diagnosis not present

## 2019-11-03 DIAGNOSIS — R351 Nocturia: Secondary | ICD-10-CM | POA: Diagnosis not present

## 2019-11-03 DIAGNOSIS — I10 Essential (primary) hypertension: Secondary | ICD-10-CM | POA: Diagnosis not present

## 2019-11-03 DIAGNOSIS — Z0000 Encounter for general adult medical examination without abnormal findings: Secondary | ICD-10-CM | POA: Diagnosis not present

## 2019-11-03 DIAGNOSIS — Z20828 Contact with and (suspected) exposure to other viral communicable diseases: Secondary | ICD-10-CM | POA: Diagnosis not present

## 2019-11-03 DIAGNOSIS — E559 Vitamin D deficiency, unspecified: Secondary | ICD-10-CM | POA: Diagnosis not present

## 2019-11-03 DIAGNOSIS — E7800 Pure hypercholesterolemia, unspecified: Secondary | ICD-10-CM | POA: Diagnosis not present

## 2019-11-15 DIAGNOSIS — N200 Calculus of kidney: Secondary | ICD-10-CM | POA: Diagnosis not present

## 2020-01-18 DIAGNOSIS — F341 Dysthymic disorder: Secondary | ICD-10-CM | POA: Diagnosis not present

## 2020-01-18 DIAGNOSIS — E1151 Type 2 diabetes mellitus with diabetic peripheral angiopathy without gangrene: Secondary | ICD-10-CM | POA: Diagnosis not present

## 2020-01-18 DIAGNOSIS — E785 Hyperlipidemia, unspecified: Secondary | ICD-10-CM | POA: Diagnosis not present

## 2020-01-18 DIAGNOSIS — M1990 Unspecified osteoarthritis, unspecified site: Secondary | ICD-10-CM | POA: Diagnosis not present

## 2020-01-18 DIAGNOSIS — I10 Essential (primary) hypertension: Secondary | ICD-10-CM | POA: Diagnosis not present

## 2020-01-27 DIAGNOSIS — F329 Major depressive disorder, single episode, unspecified: Secondary | ICD-10-CM | POA: Diagnosis not present

## 2020-01-27 DIAGNOSIS — F411 Generalized anxiety disorder: Secondary | ICD-10-CM | POA: Diagnosis not present

## 2020-01-30 DIAGNOSIS — I10 Essential (primary) hypertension: Secondary | ICD-10-CM | POA: Diagnosis not present

## 2020-01-30 DIAGNOSIS — E782 Mixed hyperlipidemia: Secondary | ICD-10-CM | POA: Diagnosis not present

## 2020-01-30 DIAGNOSIS — N3941 Urge incontinence: Secondary | ICD-10-CM | POA: Diagnosis not present

## 2020-01-30 DIAGNOSIS — Z79899 Other long term (current) drug therapy: Secondary | ICD-10-CM | POA: Diagnosis not present

## 2020-01-30 DIAGNOSIS — E119 Type 2 diabetes mellitus without complications: Secondary | ICD-10-CM | POA: Diagnosis not present

## 2020-02-14 DIAGNOSIS — N200 Calculus of kidney: Secondary | ICD-10-CM | POA: Diagnosis not present

## 2020-03-05 DIAGNOSIS — N529 Male erectile dysfunction, unspecified: Secondary | ICD-10-CM | POA: Diagnosis not present

## 2020-03-14 DIAGNOSIS — I739 Peripheral vascular disease, unspecified: Secondary | ICD-10-CM | POA: Diagnosis not present

## 2020-03-14 DIAGNOSIS — I6523 Occlusion and stenosis of bilateral carotid arteries: Secondary | ICD-10-CM | POA: Diagnosis not present

## 2020-05-03 DIAGNOSIS — R209 Unspecified disturbances of skin sensation: Secondary | ICD-10-CM | POA: Diagnosis not present

## 2020-05-03 DIAGNOSIS — E7800 Pure hypercholesterolemia, unspecified: Secondary | ICD-10-CM | POA: Diagnosis not present

## 2020-05-03 DIAGNOSIS — I10 Essential (primary) hypertension: Secondary | ICD-10-CM | POA: Diagnosis not present

## 2020-05-03 DIAGNOSIS — E1165 Type 2 diabetes mellitus with hyperglycemia: Secondary | ICD-10-CM | POA: Diagnosis not present

## 2020-05-24 DIAGNOSIS — N529 Male erectile dysfunction, unspecified: Secondary | ICD-10-CM | POA: Diagnosis not present

## 2020-07-20 DIAGNOSIS — F329 Major depressive disorder, single episode, unspecified: Secondary | ICD-10-CM | POA: Diagnosis not present

## 2020-07-20 DIAGNOSIS — F411 Generalized anxiety disorder: Secondary | ICD-10-CM | POA: Diagnosis not present

## 2020-07-25 DIAGNOSIS — I10 Essential (primary) hypertension: Secondary | ICD-10-CM | POA: Diagnosis not present

## 2020-07-25 DIAGNOSIS — E785 Hyperlipidemia, unspecified: Secondary | ICD-10-CM | POA: Diagnosis not present

## 2020-07-25 DIAGNOSIS — E119 Type 2 diabetes mellitus without complications: Secondary | ICD-10-CM | POA: Diagnosis not present

## 2020-08-13 DIAGNOSIS — R1013 Epigastric pain: Secondary | ICD-10-CM | POA: Diagnosis not present

## 2020-08-13 DIAGNOSIS — K859 Acute pancreatitis without necrosis or infection, unspecified: Secondary | ICD-10-CM | POA: Diagnosis not present

## 2020-08-13 DIAGNOSIS — K8020 Calculus of gallbladder without cholecystitis without obstruction: Secondary | ICD-10-CM | POA: Diagnosis not present

## 2020-08-13 DIAGNOSIS — R079 Chest pain, unspecified: Secondary | ICD-10-CM | POA: Diagnosis not present

## 2020-08-13 DIAGNOSIS — R110 Nausea: Secondary | ICD-10-CM | POA: Diagnosis not present

## 2020-08-13 DIAGNOSIS — R109 Unspecified abdominal pain: Secondary | ICD-10-CM | POA: Diagnosis not present

## 2020-08-13 DIAGNOSIS — K760 Fatty (change of) liver, not elsewhere classified: Secondary | ICD-10-CM | POA: Diagnosis not present

## 2020-08-13 DIAGNOSIS — I700 Atherosclerosis of aorta: Secondary | ICD-10-CM | POA: Diagnosis not present

## 2020-08-13 DIAGNOSIS — N3289 Other specified disorders of bladder: Secondary | ICD-10-CM | POA: Diagnosis not present

## 2020-08-13 DIAGNOSIS — K8689 Other specified diseases of pancreas: Secondary | ICD-10-CM | POA: Diagnosis not present

## 2020-08-13 DIAGNOSIS — R0789 Other chest pain: Secondary | ICD-10-CM | POA: Diagnosis not present

## 2020-08-14 DIAGNOSIS — K760 Fatty (change of) liver, not elsewhere classified: Secondary | ICD-10-CM | POA: Diagnosis not present

## 2020-08-14 DIAGNOSIS — R109 Unspecified abdominal pain: Secondary | ICD-10-CM | POA: Diagnosis not present

## 2020-08-14 DIAGNOSIS — K8020 Calculus of gallbladder without cholecystitis without obstruction: Secondary | ICD-10-CM | POA: Diagnosis not present

## 2020-08-20 ENCOUNTER — Other Ambulatory Visit: Payer: Self-pay | Admitting: Gastroenterology

## 2020-08-20 DIAGNOSIS — E1165 Type 2 diabetes mellitus with hyperglycemia: Secondary | ICD-10-CM | POA: Diagnosis not present

## 2020-08-20 DIAGNOSIS — R1310 Dysphagia, unspecified: Secondary | ICD-10-CM | POA: Diagnosis not present

## 2020-08-20 DIAGNOSIS — I10 Essential (primary) hypertension: Secondary | ICD-10-CM | POA: Diagnosis not present

## 2020-08-20 DIAGNOSIS — K8020 Calculus of gallbladder without cholecystitis without obstruction: Secondary | ICD-10-CM | POA: Diagnosis not present

## 2020-08-20 DIAGNOSIS — K219 Gastro-esophageal reflux disease without esophagitis: Secondary | ICD-10-CM | POA: Diagnosis not present

## 2020-08-20 DIAGNOSIS — K851 Biliary acute pancreatitis without necrosis or infection: Secondary | ICD-10-CM | POA: Diagnosis not present

## 2020-08-21 ENCOUNTER — Encounter (HOSPITAL_COMMUNITY): Payer: Self-pay | Admitting: Gastroenterology

## 2020-08-28 ENCOUNTER — Other Ambulatory Visit (HOSPITAL_COMMUNITY)
Admission: RE | Admit: 2020-08-28 | Discharge: 2020-08-28 | Disposition: A | Discharge status: Home / Self Care | Payer: Medicare Other | Source: Ambulatory Visit | Attending: Gastroenterology | Admitting: Gastroenterology

## 2020-08-28 DIAGNOSIS — Z20822 Contact with and (suspected) exposure to covid-19: Secondary | ICD-10-CM | POA: Diagnosis not present

## 2020-08-28 DIAGNOSIS — Z01812 Encounter for preprocedural laboratory examination: Secondary | ICD-10-CM | POA: Diagnosis not present

## 2020-08-28 LAB — SARS CORONAVIRUS 2 (TAT 6-24 HRS): SARS Coronavirus 2: NEGATIVE

## 2020-08-30 NOTE — Anesthesia Preprocedure Evaluation (Addendum)
Anesthesia Evaluation  Patient identified by MRN, date of birth, ID band Patient awake    Reviewed: Allergy & Precautions, NPO status , Patient's Chart, lab work & pertinent test results  Airway Mallampati: II  TM Distance: >3 FB Neck ROM: Full    Dental no notable dental hx. (+) Teeth Intact, Dental Advisory Given   Pulmonary former smoker,    Pulmonary exam normal breath sounds clear to auscultation       Cardiovascular hypertension, Pt. on medications and Pt. on home beta blockers Normal cardiovascular exam Rhythm:Regular Rate:Normal     Neuro/Psych PSYCHIATRIC DISORDERS Anxiety  Neuromuscular disease    GI/Hepatic hiatal hernia, GERD  ,  Endo/Other  diabetes, Well Controlled, Oral Hypoglycemic Agents  Renal/GU      Musculoskeletal  (+) Arthritis ,   Abdominal (+) + obese,   Peds  Hematology   Anesthesia Other Findings   Reproductive/Obstetrics                            Anesthesia Physical Anesthesia Plan  ASA: III  Anesthesia Plan: MAC   Post-op Pain Management:    Induction: Intravenous  PONV Risk Score and Plan: Treatment may vary due to age or medical condition  Airway Management Planned: Nasal Cannula and Natural Airway  Additional Equipment: None  Intra-op Plan:   Post-operative Plan:   Informed Consent: I have reviewed the patients History and Physical, chart, labs and discussed the procedure including the risks, benefits and alternatives for the proposed anesthesia with the patient or authorized representative who has indicated his/her understanding and acceptance.     Dental advisory given  Plan Discussed with: CRNA and Anesthesiologist  Anesthesia Plan Comments: (UEU for pancreatitis)       Anesthesia Quick Evaluation

## 2020-08-31 ENCOUNTER — Ambulatory Visit (HOSPITAL_COMMUNITY)
Admission: RE | Admit: 2020-08-31 | Discharge: 2020-08-31 | Disposition: A | Discharge status: Home / Self Care | Payer: Medicare Other | Attending: Gastroenterology | Admitting: Gastroenterology

## 2020-08-31 ENCOUNTER — Ambulatory Visit (HOSPITAL_COMMUNITY): Payer: Medicare Other | Admitting: Anesthesiology

## 2020-08-31 ENCOUNTER — Encounter (HOSPITAL_COMMUNITY)
Admission: RE | Disposition: A | Discharge status: Home / Self Care | Payer: Self-pay | Source: Home / Self Care | Attending: Gastroenterology

## 2020-08-31 ENCOUNTER — Encounter (HOSPITAL_COMMUNITY): Payer: Self-pay | Admitting: Gastroenterology

## 2020-08-31 DIAGNOSIS — R1310 Dysphagia, unspecified: Secondary | ICD-10-CM | POA: Insufficient documentation

## 2020-08-31 DIAGNOSIS — K8020 Calculus of gallbladder without cholecystitis without obstruction: Secondary | ICD-10-CM | POA: Insufficient documentation

## 2020-08-31 DIAGNOSIS — K859 Acute pancreatitis without necrosis or infection, unspecified: Secondary | ICD-10-CM | POA: Insufficient documentation

## 2020-08-31 DIAGNOSIS — K219 Gastro-esophageal reflux disease without esophagitis: Secondary | ICD-10-CM | POA: Diagnosis not present

## 2020-08-31 DIAGNOSIS — E119 Type 2 diabetes mellitus without complications: Secondary | ICD-10-CM | POA: Diagnosis not present

## 2020-08-31 DIAGNOSIS — I10 Essential (primary) hypertension: Secondary | ICD-10-CM | POA: Diagnosis not present

## 2020-08-31 DIAGNOSIS — K851 Biliary acute pancreatitis without necrosis or infection: Secondary | ICD-10-CM | POA: Diagnosis not present

## 2020-08-31 DIAGNOSIS — K8689 Other specified diseases of pancreas: Secondary | ICD-10-CM | POA: Insufficient documentation

## 2020-08-31 DIAGNOSIS — Z87891 Personal history of nicotine dependence: Secondary | ICD-10-CM | POA: Diagnosis not present

## 2020-08-31 HISTORY — DX: Type 2 diabetes mellitus without complications: E11.9

## 2020-08-31 HISTORY — PX: ESOPHAGOGASTRODUODENOSCOPY (EGD) WITH PROPOFOL: SHX5813

## 2020-08-31 HISTORY — PX: UPPER ESOPHAGEAL ENDOSCOPIC ULTRASOUND (EUS): SHX6562

## 2020-08-31 LAB — GLUCOSE, CAPILLARY
Glucose-Capillary: 111 mg/dL — ABNORMAL HIGH (ref 70–99)
Glucose-Capillary: 110 mg/dL — ABNORMAL HIGH (ref 70–99)

## 2020-08-31 SURGERY — UPPER ESOPHAGEAL ENDOSCOPIC ULTRASOUND (EUS)
Anesthesia: Monitor Anesthesia Care

## 2020-08-31 MED ORDER — SODIUM CHLORIDE 0.9 % IV SOLN: INTRAVENOUS | Status: DC

## 2020-08-31 MED ORDER — LACTATED RINGERS IV SOLN
INTRAVENOUS | Status: DC
  Administered 2020-08-31: 1000 mL via INTRAVENOUS

## 2020-08-31 MED ORDER — PROPOFOL 10 MG/ML IV BOLUS
INTRAVENOUS | Status: DC | PRN
  Administered 2020-08-31: 20 mg via INTRAVENOUS
  Administered 2020-08-31: 50 mg via INTRAVENOUS
  Administered 2020-08-31 (×2): 20 mg via INTRAVENOUS
  Administered 2020-08-31: 50 mg via INTRAVENOUS
  Administered 2020-08-31 (×3): 20 mg via INTRAVENOUS
  Administered 2020-08-31: 50 mg via INTRAVENOUS

## 2020-08-31 MED ORDER — LIDOCAINE 2% (20 MG/ML) 5 ML SYRINGE
INTRAMUSCULAR | Status: DC | PRN
  Administered 2020-08-31: 40 mg via INTRAVENOUS

## 2020-08-31 NOTE — Transfer of Care (Signed)
Immediate Anesthesia Transfer of Care Note  Patient: [name]  Procedure(s) Performed: UPPER ESOPHAGEAL ENDOSCOPIC ULTRASOUND (EUS) (N/A )  Patient Location: PACU and Endoscopy Unit  Anesthesia Type:MAC  Level of Consciousness: sedated  Airway & Oxygen Therapy:  Post-op Assessment: Report given to RN and Post -op Vital signs reviewed and stable  Post vital signs: Reviewed and stable  Last Vitals:  Vitals Value Taken Time  BP    Temp    Pulse 61 08/31/20 1031  Resp 16 08/31/20 1031  SpO2 98 % 08/31/20 1031  Vitals shown include unvalidated device data.  Last Pain:  Vitals:   08/31/20 0914  TempSrc: Oral  PainSc: 6       Patients Stated Pain Goal: 3 (88/41/66 0630)  Complications: No complications documented.

## 2020-08-31 NOTE — Op Note (Signed)
[facility] Patient Name: [name] Procedure Date: 08/31/2020 MRN: [id number] Attending MD: Jeani Hawking , MD Date of Birth: 1960/11/28 CSN: [id number] Age: 59 Admit Type: Outpatient Procedure:                Upper EUS Indications:              Abnormal abdominal/pelvic CT scan Providers:                Jeani Hawking, MD, Dwain Sarna, RN, Brion Aliment, Technician, Rosilyn Mings, Technician,                            Heron Nay, CRNA Referring MD:              Medicines:                Propofol per Anesthesia Complications:            No immediate complications. Estimated Blood Loss:     Estimated blood loss: none. Procedure:                Pre-Anesthesia Assessment:                           - Prior to the procedure, a History and Physical                            was performed, and patient medications and                            allergies were reviewed. The patient's tolerance of                            previous anesthesia was also reviewed. The risks                            and benefits of the procedure and the sedation                            options and risks were discussed with the patient.                            All questions were answered, and informed consent                            was obtained. Prior Anticoagulants: The patient has                            taken no previous anticoagulant or antiplatelet                            agents. ASA Grade Assessment: II - A patient with                            mild  systemic disease. After reviewing the risks                            and benefits, the patient was deemed in                            satisfactory condition to undergo the procedure.                           - Sedation was administered by an anesthesia                            professional. Deep sedation was attained.                           After obtaining informed consent,  the endoscope was                            passed under direct vision. Throughout the                            procedure, the patient's blood pressure, pulse, and                            oxygen saturations were monitored continuously. The                            [id number] ([id number]) Olympus Linear EUS was                            introduced through the mouth, and advanced to the                            second part of duodenum. The upper EUS was                            accomplished without difficulty. The patient                            tolerated the procedure well. Scope In: Scope Out: Findings:      ENDOSONOGRAPHIC FINDING: :      Many stones were visualized endosonographically in the gallbladder body.       The stones measured up to 7 mm in greatest dimension. The stones were       oval. They were hyperechoic and characterized by shadowing.      Pancreatic parenchymal abnormalities were noted in the pancreatic body       and pancreatic tail. These consisted of hyperechoic strands and       lobularity.      The pancreatic parenchyma did show lobularity and it was more pronounced       in the distal body/tail of the pancreas. There was no evidence of any       masses. The patient was recently diagnosed acute pancreatitis several       weeks ago. There was no evidence of any PD or  CBD ductal enlargement,       stones, or sludge in the ducts. Impression:               - Many stones were visualized endosonographically                            in the gallbladder body.                           - Pancreatic parenchymal abnormalities consisting                            of hyperechoic strands and lobularity were noted in                            the pancreatic body and pancreatic tail.                           - No specimens collected. Moderate Sedation:      Not Applicable - Patient had care per Anesthesia. Recommendation:           - Patient has a contact number  available for                            emergencies. The signs and symptoms of potential                            delayed complications were discussed with the                            patient. Return to normal activities tomorrow.                            Written discharge instructions were provided to the                            patient.                           - Resume previous diet.                           - Refer to surgery for symptomatic gallstones. Procedure Code(s):        --- Professional ---                           [phone], Esophagogastroduodenoscopy, flexible,                            transoral; with endoscopic ultrasound examination                            limited to the esophagus, stomach or duodenum, and                            adjacent structures Diagnosis Code(s):        ---  Professional ---                           K80.20, Calculus of gallbladder without                            cholecystitis without obstruction                           K86.9, Disease of pancreas, unspecified                           R93.5, Abnormal findings on diagnostic imaging of                            other abdominal regions, including retroperitoneum CPT copyright 2019 American [employer] Association. All rights reserved. The codes documented in this report are preliminary and upon coder review may  be revised to meet current compliance requirements. Jeani Hawking, MD Jeani Hawking, MD 08/31/2020 10:38:48 AM This report has been signed electronically. Number of Addenda: 0

## 2020-08-31 NOTE — Discharge Instructions (Signed)

## 2020-08-31 NOTE — H&P (Signed)
[name] HPI: On 08/13/2020 he presented to [facility] with complaints of epigastric pain and chest pain. Work up in the ER showed that he had a mild pancreatitis in the tail of his pancreas. The RUQ U/S showed a 3 mm CBD, steatosis, and cholelithiasis without cholecystitis. The largest stone was at 6 mm. A CT scan of the abdomen confirmed the pancreatitis in the tail of the pancreas and it was mild. The patient does recall having the pain for two weeks prior to his presentation. The symptoms are worse during the evening hours and it is pressure-type of sensation. Additionally, he reports an issue with dysphagia with PO intake and he takes Dexilant on a routine basis. In fact, he tried to skip a day of Dexilant, but he had the prompt return of his symptoms.   Past Medical History:  Diagnosis Date  . Allergic rhinitis   . Anxiety   . Back pain with radiation 08-04-2014  . Depression   . Diabetes mellitus without complication (HCC)    takes metformen  . DJD (degenerative joint disease)   . Gastroesophageal reflux disease   . History of hiatal hernia   . Hyperlipidemia   . Hypermetropia   . Hypertension   . Lumbar radiculopathy   . Lumbar radiculopathy 03-12-2015  . Lumbosacral neuritis 05-02-2013  . Osteoarthritis    DDD- lumbar  . Presbyopia     Past Surgical History:  Procedure Laterality Date  . ABDOMINAL EXPOSURE N/A 08/01/2015   Procedure: ABDOMINAL EXPOSURE;  Surgeon: Larina Earthly, MD;  Location: [facility] OR;  Service: Vascular;  Laterality: N/A;  . ANTERIOR LUMBAR FUSION  08/01/2015   LEVEL 2  . ANTERIOR LUMBAR FUSION N/A 08/01/2015   Procedure: ANTERIOR LUMBAR FUSION 2 LEVELS;  Surgeon: Estill Bamberg, MD;  Location: [facility] OR;  Service: Orthopedics;  Laterality: N/A;  Lumbar 4-5, lumbar 5-sacrum 1 Anterior lumbar interbody fusion  . BACK SURGERY  [phone]  . epidural injections    . HEMORROIDECTOMY  11/12/2011  . LAMINECTOMY  08/25/2006  . LUMBAR  LAMINECTOMY      Family History  Problem Relation Age of Onset  . Hypertension Mother   . Hypertension Father     Social History:  reports that he quit smoking about 11 years ago. His smoking use included cigarettes. He uses smokeless tobacco. He reports current alcohol use of about 2.0 - 3.0 standard drinks of alcohol per week. He reports that he does not use drugs.  Allergies:  Allergies  Allergen Reactions  . Gabapentin Swelling  . Lisinopril Swelling  . Olmesartan-Amlodipine-Hctz Hives    Medications:  Scheduled:  Continuous: . sodium chloride      No results found for this or any previous visit (from the past 24 hour(s)).   No results found.  ROS:  As stated above in the HPI otherwise negative.  Height 5\' 9"  (1.753 m), weight 114.8 kg.    PE: Gen: NAD, Alert and Oriented HEENT:  [place]/AT, EOMI Neck: Supple, no LAD Lungs: CTA Bilaterally CV: RRR without M/G/R ABD: Soft, NTND, +BS Ext: No C/C/E  Assessment/Plan: 1) Acute pancreatitis. 2) Cholelithiasis.   Review of the patient's records shows that his pancreatitis is most likely biliary in etiology, but there was no dilation of his ducts or elevated liver enzymes. It is reasonable to perform an EUS to ensure that no other pathology exists in the pancreas. He will be referred to surgery for symptomatic gallstones.  Plan: 1) EUS.  Uilani Sanville D 08/31/2020, 9:14 AM

## 2020-08-31 NOTE — Anesthesia Postprocedure Evaluation (Signed)
Anesthesia Post Note  Patient: RESHAD SAAB  Procedure(s) Performed: UPPER ESOPHAGEAL ENDOSCOPIC ULTRASOUND (EUS) (N/A )     Patient location during evaluation: Endoscopy Anesthesia Type: MAC Level of consciousness: awake and alert Pain management: pain level controlled Vital Signs Assessment: post-procedure vital signs reviewed and stable Respiratory status: spontaneous breathing, nonlabored ventilation, respiratory function stable and patient connected to nasal cannula oxygen Cardiovascular status: blood pressure returned to baseline and stable Postop Assessment: no apparent nausea or vomiting Anesthetic complications: no   No complications documented.  Last Vitals:  Vitals:   08/31/20 1040 08/31/20 1050  BP: 97/60 111/64  Pulse: (!) 57 (!) 55  Resp: 15 (!) 0  Temp:    SpO2: 96% 100%    Last Pain:  Vitals:   08/31/20 1050  TempSrc:   PainSc: 0-No pain                 Barnet Glasgow

## 2020-08-31 NOTE — Anesthesia Procedure Notes (Signed)
Procedure Name: MAC Date/Time: 08/31/2020 10:01 AM Performed by: Cynda Familia, CRNA Pre-anesthesia Checklist: Patient identified, Emergency Drugs available, Suction available, Timeout performed and Patient being monitored Patient Re-evaluated:Patient Re-evaluated prior to induction Oxygen Delivery Method: Simple face mask Placement Confirmation: positive ETCO2 and breath sounds checked- equal and bilateral Dental Injury: Teeth and Oropharynx as per pre-operative assessment  Comments: Bite block placed by RN

## 2020-09-03 ENCOUNTER — Encounter (HOSPITAL_COMMUNITY): Payer: Self-pay | Admitting: Gastroenterology

## 2020-09-07 DIAGNOSIS — N138 Other obstructive and reflux uropathy: Secondary | ICD-10-CM | POA: Diagnosis not present

## 2020-09-07 DIAGNOSIS — R3589 Other polyuria: Secondary | ICD-10-CM | POA: Diagnosis not present

## 2020-09-07 DIAGNOSIS — N401 Enlarged prostate with lower urinary tract symptoms: Secondary | ICD-10-CM | POA: Diagnosis not present

## 2020-09-25 ENCOUNTER — Ambulatory Visit: Payer: Self-pay | Admitting: Surgery

## 2020-09-25 DIAGNOSIS — K851 Biliary acute pancreatitis without necrosis or infection: Secondary | ICD-10-CM | POA: Diagnosis not present

## 2020-09-25 NOTE — H&P (Signed)
History of Present Illness ([name] L. Freida Busman MD; 09/25/2020 11:08 AM) The patient is a 59 year old male who presents for evaluation of gall stones.HPI: [name] is a 59 yo male who was referred for evaluation for cholecystectomy. In late October, he had an episode of severe upper abdominal pain and was seen in the [facility] ED. CT scan showed peripancreatic inflammation, primarily in the tail of the pancreas, as well as numerous gallstones. LFTs were reportedly normal. He was ultimately discharged from the ED and referred to Dr. Elnoria Howard for outpatient EUS. He had the EUS on 11/12, which showed changes consistent with pancreatitis but no pancreatic masses. There were no abnormalities or dilation of the PD or CBD. There were numerous stones visualized in the gallbladder.  The patient has no family history of pancreatitis and has never had pancreatitis prior to this recent episode. He quit smoking many years ago and reports an occasional glass of wine with dinner, but is not a heavy drinker. His abdominal pain has significantly improved but he says he sometimes has discomfort after meals.  PMH: DM, GERD, HLD, HTN, OA  PSH: lumbar fusion (anterior approach), hemorrhoidectomy  FHx: HTN (mother and father)  Social: former smoker (quit 2010)    Past Surgical History Brandon Florida, RN; 09/25/2020 11:00 AM) Hemorrhoidectomy  Spinal Surgery - Lower Back   Diagnostic Studies History Brandon Florida, RN; 09/25/2020 11:00 AM) Colonoscopy  1-5 years ago  Allergies Brandon Florida, RN; 09/25/2020 10:28 AM) Gabapentin *ANTICONVULSANTS*  Swelling. Lisinopril *ANTIHYPERTENSIVES*  Swelling. Olmesartan-amLODIPine-HCTZ *ANTIHYPERTENSIVES*  Hives. Allergies Reconciled   Medication History (Brandon Herrin, RN; 09/25/2020 10:31 AM) Temazepam (15MG  Capsule, Oral) Active. amLODIPine Besylate (10MG  Tablet, Oral) Active. Atorvastatin Calcium (10MG  Tablet, Oral) Active. Dexilant (60MG  Capsule DR, Oral)  Active. metFORMIN HCl (1000MG  Tablet, Oral) Active. Metoprolol Succinate ER (50MG  Tablet ER 24HR, Oral) Active. Vitamin D3 (1.25 MG(50000 UT) Capsule, Oral) Active. Ziprasidone HCl (20MG  Capsule, Oral) Active. Magnesium (300MG  Capsule, Oral) Active. DULoxetine HCl (60MG  Capsule DR Part, Oral) Active. Losartan Potassium-HCTZ (100-12.5MG  Tablet, Oral) Active. Medications Reconciled  Social History , RN; 09/25/2020 11:00 AM) Alcohol use  Occasional alcohol use. Caffeine use  Coffee. No drug use  Tobacco use  Former smoker.  Family History (Brandon Herrin, RN; 09/25/2020 11:00 AM) Heart disease in male family member before age 11  Hypertension  Father, Mother.  Other Problems (Brandon Herrin, RN; 09/25/2020 11:00 AM) Anxiety Disorder  Depression  Diabetes Mellitus  Gastroesophageal Reflux Disease  High blood pressure  Pancreatitis  Vascular Disease     Review of Systems (Brandon Herrin RN; 09/25/2020 11:00 AM) General Present- Appetite Loss and Weight Loss. Not Present- Chills, Fatigue, Fever, Night Sweats and Weight Gain. Skin Present- Dryness and Non-Healing Wounds. Not Present- Change in Wart/Mole, Hives, Jaundice, New Lesions, Rash and Ulcer. HEENT Not Present- Earache, Hearing Loss, Hoarseness, Nose Bleed, Oral Ulcers, Ringing in the Ears, Seasonal Allergies, Sinus Pain, Sore Throat, Visual Disturbances, Wears glasses/contact lenses and Yellow Eyes. Respiratory Not Present- Bloody sputum, Chronic Cough, Difficulty Breathing, Snoring and Wheezing. Breast Not Present- Breast Mass, Breast Pain, Nipple Discharge and Skin Changes. Cardiovascular Present- Leg Cramps and Swelling of Extremities. Not Present- Chest Pain, Difficulty Breathing Lying Down, Palpitations, Rapid Heart Rate and Shortness of Breath. Gastrointestinal Present- Abdominal Pain, Bloating, Change in Bowel Habits, Chronic diarrhea, Excessive gas, Gets full quickly at meals and Nausea. Not  Present- Bloody Stool, Constipation, Difficulty Swallowing, Hemorrhoids, Indigestion, Rectal Pain and Vomiting. Male Genitourinary Present- Frequency, Impotence and Nocturia. Not  Present- Blood in Urine, Change in Urinary Stream, Painful Urination, Urgency and Urine Leakage. Musculoskeletal Present- Back Pain, Joint Stiffness and Muscle Weakness. Not Present- Joint Pain, Muscle Pain and Swelling of Extremities. Neurological Present- Tingling. Not Present- Decreased Memory, Fainting, Headaches, Numbness, Seizures, Tremor, Trouble walking and Weakness. Psychiatric Present- Anxiety and Depression. Not Present- Bipolar, Change in Sleep Pattern, Fearful and Frequent crying. Endocrine Present- New Diabetes. Not Present- Cold Intolerance, Excessive Hunger, Hair Changes, Heat Intolerance and Hot flashes. Hematology Not Present- Blood Thinners, Easy Bruising, Excessive bleeding, Gland problems, HIV and Persistent Infections.  Vitals (Brandon Herrin RN; 09/25/2020 10:31 AM) 09/25/2020 10:31 AM Weight: 262 lb Height: 70in Body Surface Area: 2.34 m Body Mass Index: 37.59 kg/m  Temp.: 97.74F  Pulse: 86 (Regular)  P.OX: 96% (Room air) BP: 126/80(Sitting, Left Arm, Standard)       Physical Exam (Perla Echavarria L. Freida Busman MD; 09/25/2020 11:09 AM) The physical exam findings are as follows: Note: Constitutional: No acute distress; conversant; no deformities Neuro: alert and oriented; cranial nerves grossly in tact; no focal deficits Eyes: Moist conjunctiva; anicteric sclerae; extraocular movements in tact Neck: Trachea midline Lungs: Normal respiratory effort; lungs clear to auscultation bilaterally; symmetric chest wall expansion CV: Regular rate and rhythm; no murmurs; no pitting edema GI: Abdomen soft, nontender, nondistended; no masses or organomegaly; well-healed scar to the left of lower midline MSK: Normal gait and station; no clubbing/cyanosis Psychiatric: Appropriate affect; alert and oriented  3    Assessment & Plan (Ramzy Cappelletti L. Freida Busman MD; 09/25/2020 11:14 AM) GALLSTONE PANCREATITIS (K85.10) Impression: 59 yo male referred for cholecystectomy after a recent episode of pancreatitis, thought to be biliary in etiology. I reviewed the patient's outside CT scan, his EUS report and Dr. Haywood Pao notes. CT scan shows very mild stranding around the tail of the pancreas, but no PD or biliary ductal dilation. It is somewhat unusual that the inflammation is focused in the tail, however there is no other clear etiology and no masses on EUS. In the setting of cholelithiasis I think gallstones are the most likely etiology of his pancreatitis. Cholecystectomy is recommended to minimize risk of recurrent pancreatitis. No need for IOC as the bile duct was imaged during the EUS. I discussed the details of laparoscopic cholecystectomy, and went through the risks and benefits including <1% risk of common bile duct injury. He agrees to proceed with surgery. He will be scheduled as an outpatient. Education booklet provided.  Sophronia Simas, MD [facility] Surgery General, Hepatobiliary and Pancreatic Surgery 09/25/20 11:15 AM

## 2020-10-01 DIAGNOSIS — N138 Other obstructive and reflux uropathy: Secondary | ICD-10-CM | POA: Diagnosis not present

## 2020-10-01 DIAGNOSIS — N401 Enlarged prostate with lower urinary tract symptoms: Secondary | ICD-10-CM | POA: Diagnosis not present

## 2020-12-21 ENCOUNTER — Other Ambulatory Visit (HOSPITAL_COMMUNITY)
Admission: RE | Admit: 2020-12-21 | Discharge: 2020-12-21 | Disposition: A | Discharge status: Home / Self Care | Payer: Medicare Other | Source: Ambulatory Visit | Attending: Surgery | Admitting: Surgery

## 2020-12-21 DIAGNOSIS — Z01812 Encounter for preprocedural laboratory examination: Secondary | ICD-10-CM | POA: Diagnosis present

## 2020-12-21 DIAGNOSIS — Z20822 Contact with and (suspected) exposure to covid-19: Secondary | ICD-10-CM | POA: Diagnosis not present

## 2020-12-21 LAB — SARS CORONAVIRUS 2 (TAT 6-24 HRS): SARS Coronavirus 2: NEGATIVE

## 2020-12-21 NOTE — Progress Notes (Signed)
[name] denies chest pain or shortness of breath. Patient was tested for Covid today and has been in quarantine since that time. [name] has type II diabetes, patient reports that CBg averages 120.  I instructed patient to hold Metformin on Monday. I instructed patient to check CBG after awaking and every 2 hours until arrival  to the hospital.  I Instructed patient if CBG is less than 70 to drink  1/2 cup of a clear juice. Recheck CBG in 15 minutes if CBG is not > 70,  call pre- op desk at [phone] for further instructions

## 2020-12-23 NOTE — Anesthesia Preprocedure Evaluation (Addendum)
Anesthesia Evaluation  Patient identified by MRN, date of birth, ID band Patient awake    Reviewed: Allergy & Precautions, H&P , NPO status , Patient's Chart, lab work & pertinent test results  Airway Mallampati: III  TM Distance: >3 FB Neck ROM: Full    Dental no notable dental hx. (+) Partial Lower, Partial Upper, Dental Advisory Given   Pulmonary neg pulmonary ROS, former smoker,    Pulmonary exam normal breath sounds clear to auscultation       Cardiovascular Exercise Tolerance: Good hypertension, Pt. on medications and Pt. on home beta blockers  Rhythm:Regular Rate:Normal     Neuro/Psych Anxiety Depression negative neurological ROS     GI/Hepatic Neg liver ROS, hiatal hernia, GERD  Medicated,  Endo/Other  diabetes, Type 2, Oral Hypoglycemic AgentsMorbid obesity  Renal/GU negative Renal ROS  negative genitourinary   Musculoskeletal  (+) Arthritis , Osteoarthritis,    Abdominal   Peds  Hematology negative hematology ROS (+)   Anesthesia Other Findings   Reproductive/Obstetrics negative OB ROS                            Anesthesia Physical Anesthesia Plan  ASA: III  Anesthesia Plan: General   Post-op Pain Management:    Induction: Intravenous  PONV Risk Score and Plan: 3 and Ondansetron, Dexamethasone and Midazolam  Airway Management Planned: Oral ETT  Additional Equipment:   Intra-op Plan:   Post-operative Plan: Extubation in OR  Informed Consent: I have reviewed the patients History and Physical, chart, labs and discussed the procedure including the risks, benefits and alternatives for the proposed anesthesia with the patient or authorized representative who has indicated his/her understanding and acceptance.     Dental advisory given  Plan Discussed with: CRNA  Anesthesia Plan Comments:        Anesthesia Quick Evaluation

## 2020-12-24 ENCOUNTER — Ambulatory Visit (HOSPITAL_COMMUNITY): Payer: Medicare Other | Admitting: Anesthesiology

## 2020-12-24 ENCOUNTER — Encounter (HOSPITAL_COMMUNITY)
Admission: RE | Disposition: A | Discharge status: Home / Self Care | Payer: Self-pay | Source: Home / Self Care | Attending: Surgery

## 2020-12-24 ENCOUNTER — Other Ambulatory Visit: Payer: Self-pay

## 2020-12-24 ENCOUNTER — Ambulatory Visit (HOSPITAL_COMMUNITY)
Admission: RE | Admit: 2020-12-24 | Discharge: 2020-12-24 | Disposition: A | Discharge status: Home / Self Care | Payer: Medicare Other | Attending: Surgery | Admitting: Surgery

## 2020-12-24 ENCOUNTER — Encounter (HOSPITAL_COMMUNITY): Payer: Self-pay | Admitting: Surgery

## 2020-12-24 DIAGNOSIS — Z87891 Personal history of nicotine dependence: Secondary | ICD-10-CM | POA: Diagnosis not present

## 2020-12-24 DIAGNOSIS — K859 Acute pancreatitis without necrosis or infection, unspecified: Secondary | ICD-10-CM | POA: Diagnosis present

## 2020-12-24 DIAGNOSIS — Z79899 Other long term (current) drug therapy: Secondary | ICD-10-CM | POA: Insufficient documentation

## 2020-12-24 DIAGNOSIS — Z7984 Long term (current) use of oral hypoglycemic drugs: Secondary | ICD-10-CM | POA: Insufficient documentation

## 2020-12-24 DIAGNOSIS — K851 Biliary acute pancreatitis without necrosis or infection: Secondary | ICD-10-CM | POA: Diagnosis not present

## 2020-12-24 DIAGNOSIS — Z888 Allergy status to other drugs, medicaments and biological substances status: Secondary | ICD-10-CM | POA: Diagnosis not present

## 2020-12-24 DIAGNOSIS — K8010 Calculus of gallbladder with chronic cholecystitis without obstruction: Secondary | ICD-10-CM | POA: Insufficient documentation

## 2020-12-24 DIAGNOSIS — K429 Umbilical hernia without obstruction or gangrene: Secondary | ICD-10-CM | POA: Diagnosis not present

## 2020-12-24 HISTORY — PX: UMBILICAL HERNIA REPAIR: SHX196

## 2020-12-24 HISTORY — PX: CHOLECYSTECTOMY: SHX55

## 2020-12-24 LAB — CBC
HCT: 40.1 % (ref 39.0–52.0)
Hemoglobin: 12.8 g/dL — ABNORMAL LOW (ref 13.0–17.0)
MCH: 27.1 pg (ref 26.0–34.0)
MCHC: 31.9 g/dL (ref 30.0–36.0)
MCV: 85 fL (ref 80.0–100.0)
Platelets: 219 10*3/uL (ref 150–400)
RBC: 4.72 MIL/uL (ref 4.22–5.81)
RDW: 13.6 % (ref 11.5–15.5)
WBC: 8.9 10*3/uL (ref 4.0–10.5)
nRBC: 0 % (ref 0.0–0.2)

## 2020-12-24 LAB — BASIC METABOLIC PANEL
Anion gap: 12 (ref 5–15)
BUN: 14 mg/dL (ref 6–20)
CO2: 22 mmol/L (ref 22–32)
Calcium: 8.8 mg/dL — ABNORMAL LOW (ref 8.9–10.3)
Chloride: 101 mmol/L (ref 98–111)
Creatinine, Ser: 1.18 mg/dL (ref 0.61–1.24)
GFR, Estimated: >60 mL/min (ref >60)
Glucose, Bld: 110 mg/dL — ABNORMAL HIGH (ref 70–99)
Potassium: 3.6 mmol/L (ref 3.5–5.1)
Sodium: 135 mmol/L (ref 135–145)

## 2020-12-24 LAB — GLUCOSE, CAPILLARY
Glucose-Capillary: 113 mg/dL — ABNORMAL HIGH (ref 70–99)
Glucose-Capillary: 162 mg/dL — ABNORMAL HIGH (ref 70–99)

## 2020-12-24 SURGERY — LAPAROSCOPIC CHOLECYSTECTOMY
Anesthesia: General | Site: Abdomen

## 2020-12-24 MED ORDER — SUGAMMADEX SODIUM 200 MG/2ML IV SOLN
INTRAVENOUS | Status: DC | PRN
  Administered 2020-12-24: 300 mg via INTRAVENOUS

## 2020-12-24 MED ORDER — PHENYLEPHRINE 40 MCG/ML (10ML) SYRINGE FOR IV PUSH (FOR BLOOD PRESSURE SUPPORT)
PREFILLED_SYRINGE | INTRAVENOUS | Status: AC
  Filled 2020-12-24: qty 10

## 2020-12-24 MED ORDER — LIDOCAINE 2% (20 MG/ML) 5 ML SYRINGE
INTRAMUSCULAR | Status: DC | PRN
  Administered 2020-12-24: 60 mg via INTRAVENOUS
  Administered 2020-12-24: 40 mg via INTRAVENOUS

## 2020-12-24 MED ORDER — HYDROMORPHONE HCL 1 MG/ML IJ SOLN
INTRAMUSCULAR | Status: AC
  Filled 2020-12-24: qty 1

## 2020-12-24 MED ORDER — FENTANYL CITRATE (PF) 250 MCG/5ML IJ SOLN
INTRAMUSCULAR | Status: DC | PRN
  Administered 2020-12-24: 100 ug via INTRAVENOUS

## 2020-12-24 MED ORDER — KETOROLAC TROMETHAMINE 30 MG/ML IJ SOLN
30.0000 mg | Freq: Once | INTRAMUSCULAR | Status: AC
  Administered 2020-12-24: 30 mg via INTRAVENOUS

## 2020-12-24 MED ORDER — LIDOCAINE 2% (20 MG/ML) 5 ML SYRINGE
INTRAMUSCULAR | Status: AC
  Filled 2020-12-24: qty 5

## 2020-12-24 MED ORDER — PROPOFOL 10 MG/ML IV BOLUS
INTRAVENOUS | Status: DC | PRN
  Administered 2020-12-24 (×2): 30 mg via INTRAVENOUS
  Administered 2020-12-24: 40 mg via INTRAVENOUS
  Administered 2020-12-24: 100 mg via INTRAVENOUS

## 2020-12-24 MED ORDER — GLYCOPYRROLATE PF 0.2 MG/ML IJ SOSY
PREFILLED_SYRINGE | INTRAMUSCULAR | Status: AC
  Filled 2020-12-24: qty 1

## 2020-12-24 MED ORDER — EPHEDRINE 5 MG/ML INJ
INTRAVENOUS | Status: AC
  Filled 2020-12-24: qty 10

## 2020-12-24 MED ORDER — CALCIUM CHLORIDE 10 % IV SOLN
INTRAVENOUS | Status: AC
  Filled 2020-12-24: qty 10

## 2020-12-24 MED ORDER — HYDROMORPHONE HCL 1 MG/ML IJ SOLN
0.2500 mg | INTRAMUSCULAR | Status: DC | PRN
  Administered 2020-12-24 (×3): 0.5 mg via INTRAVENOUS

## 2020-12-24 MED ORDER — ROCURONIUM BROMIDE 100 MG/10ML IV SOLN
INTRAVENOUS | Status: DC | PRN
  Administered 2020-12-24: 60 mg via INTRAVENOUS

## 2020-12-24 MED ORDER — ACETAMINOPHEN 500 MG PO TABS
1000.0000 mg | ORAL_TABLET | ORAL | Status: AC
  Administered 2020-12-24: 1000 mg via ORAL
  Filled 2020-12-24: qty 2

## 2020-12-24 MED ORDER — EPHEDRINE SULFATE-NACL 50-0.9 MG/10ML-% IV SOSY
PREFILLED_SYRINGE | INTRAVENOUS | Status: DC | PRN
  Administered 2020-12-24 (×5): 10 mg via INTRAVENOUS

## 2020-12-24 MED ORDER — ROCURONIUM BROMIDE 10 MG/ML (PF) SYRINGE
PREFILLED_SYRINGE | INTRAVENOUS | Status: AC
  Filled 2020-12-24: qty 10

## 2020-12-24 MED ORDER — FENTANYL CITRATE (PF) 250 MCG/5ML IJ SOLN
INTRAMUSCULAR | Status: AC
  Filled 2020-12-24: qty 5

## 2020-12-24 MED ORDER — OXYCODONE HCL 5 MG PO TABS
5.0000 mg | ORAL_TABLET | Freq: Once | ORAL | Status: AC
Start: 2020-12-24 — End: 2020-12-24
  Administered 2020-12-24: 5 mg via ORAL

## 2020-12-24 MED ORDER — LACTATED RINGERS IV SOLN: INTRAVENOUS | Status: DC

## 2020-12-24 MED ORDER — KETOROLAC TROMETHAMINE 30 MG/ML IJ SOLN
INTRAMUSCULAR | Status: AC
  Filled 2020-12-24: qty 1

## 2020-12-24 MED ORDER — SODIUM CHLORIDE 0.9 % IR SOLN
Status: DC | PRN
  Administered 2020-12-24: 1000 mL

## 2020-12-24 MED ORDER — CEFAZOLIN SODIUM-DEXTROSE 2-4 GM/100ML-% IV SOLN
2.0000 g | INTRAVENOUS | Status: AC
  Administered 2020-12-24: 2 g via INTRAVENOUS
  Filled 2020-12-24: qty 100

## 2020-12-24 MED ORDER — DEXMEDETOMIDINE (PRECEDEX) IN NS 20 MCG/5ML (4 MCG/ML) IV SYRINGE
PREFILLED_SYRINGE | INTRAVENOUS | Status: AC
  Filled 2020-12-24: qty 5

## 2020-12-24 MED ORDER — BUPIVACAINE-EPINEPHRINE 0.25% -1:200000 IJ SOLN
INTRAMUSCULAR | Status: DC | PRN
  Administered 2020-12-24: 20 mL

## 2020-12-24 MED ORDER — DEXMEDETOMIDINE (PRECEDEX) IN NS 20 MCG/5ML (4 MCG/ML) IV SYRINGE
PREFILLED_SYRINGE | INTRAVENOUS | Status: DC | PRN
  Administered 2020-12-24: 20 ug via INTRAVENOUS

## 2020-12-24 MED ORDER — CALCIUM CHLORIDE 10 % IV SOLN
INTRAVENOUS | Status: DC | PRN
  Administered 2020-12-24 (×2): 100 mg via INTRAVENOUS

## 2020-12-24 MED ORDER — PHENYLEPHRINE 40 MCG/ML (10ML) SYRINGE FOR IV PUSH (FOR BLOOD PRESSURE SUPPORT)
PREFILLED_SYRINGE | INTRAVENOUS | Status: DC | PRN
  Administered 2020-12-24: 80 ug via INTRAVENOUS
  Administered 2020-12-24: 40 ug via INTRAVENOUS
  Administered 2020-12-24: 80 ug via INTRAVENOUS
  Administered 2020-12-24: 160 ug via INTRAVENOUS
  Administered 2020-12-24: 80 ug via INTRAVENOUS
  Administered 2020-12-24: 120 ug via INTRAVENOUS

## 2020-12-24 MED ORDER — STERILE WATER FOR IRRIGATION IR SOLN
Status: DC | PRN
  Administered 2020-12-24: 1000 mL

## 2020-12-24 MED ORDER — PROPOFOL 10 MG/ML IV BOLUS
INTRAVENOUS | Status: AC
  Filled 2020-12-24: qty 40

## 2020-12-24 MED ORDER — ORAL CARE MOUTH RINSE: 15.0000 mL | Freq: Once | OROMUCOSAL | Status: AC

## 2020-12-24 MED ORDER — CHLORHEXIDINE GLUCONATE 0.12 % MT SOLN
15.0000 mL | Freq: Once | OROMUCOSAL | Status: AC
  Administered 2020-12-24: 15 mL via OROMUCOSAL
  Filled 2020-12-24: qty 15

## 2020-12-24 MED ORDER — ONDANSETRON HCL 4 MG/2ML IJ SOLN
INTRAMUSCULAR | Status: AC
  Filled 2020-12-24: qty 2

## 2020-12-24 MED ORDER — ONDANSETRON HCL 4 MG/2ML IJ SOLN
INTRAMUSCULAR | Status: DC | PRN
  Administered 2020-12-24: 4 mg via INTRAVENOUS

## 2020-12-24 MED ORDER — GLYCOPYRROLATE PF 0.2 MG/ML IJ SOSY
PREFILLED_SYRINGE | INTRAMUSCULAR | Status: DC | PRN
  Administered 2020-12-24: .1 mg via INTRAVENOUS

## 2020-12-24 MED ORDER — MIDAZOLAM HCL 2 MG/2ML IJ SOLN
INTRAMUSCULAR | Status: AC
  Filled 2020-12-24: qty 2

## 2020-12-24 MED ORDER — OXYCODONE HCL 5 MG PO TABS: 5.0000 mg | ORAL_TABLET | Freq: Four times a day (QID) | ORAL | 0 refills | Status: AC | PRN

## 2020-12-24 MED ORDER — 0.9 % SODIUM CHLORIDE (POUR BTL) OPTIME
TOPICAL | Status: DC | PRN
  Administered 2020-12-24: 1000 mL

## 2020-12-24 MED ORDER — MIDAZOLAM HCL 5 MG/5ML IJ SOLN
INTRAMUSCULAR | Status: DC | PRN
  Administered 2020-12-24: 1 mg via INTRAVENOUS

## 2020-12-24 MED ORDER — OXYCODONE HCL 5 MG PO TABS
ORAL_TABLET | ORAL | Status: AC
  Filled 2020-12-24: qty 1

## 2020-12-24 SURGICAL SUPPLY — 50 items
ADH SKN CLS APL DERMABOND .7 (GAUZE/BANDAGES/DRESSINGS) ×1
APL PRP STRL LF DISP 70% ISPRP (MISCELLANEOUS) ×1
APPLIER CLIP 5 13 M/L LIGAMAX5 (MISCELLANEOUS) ×2
APR CLP MED LRG 5 ANG JAW (MISCELLANEOUS) ×1
BAG SPEC RTRVL LRG 6X4 10 (ENDOMECHANICALS) ×1
BLADE CLIPPER SURG (BLADE) IMPLANT
CANISTER SUCT 3000ML PPV (MISCELLANEOUS) ×2 IMPLANT
CHLORAPREP W/TINT 26 (MISCELLANEOUS) ×2 IMPLANT
CLIP APPLIE 5 13 M/L LIGAMAX5 (MISCELLANEOUS) ×1 IMPLANT
COVER SURGICAL LIGHT HANDLE (MISCELLANEOUS) ×2 IMPLANT
COVER WAND RF STERILE (DRAPES) ×2 IMPLANT
DERMABOND ADVANCED (GAUZE/BANDAGES/DRESSINGS) ×1
DERMABOND ADVANCED .7 DNX12 (GAUZE/BANDAGES/DRESSINGS) IMPLANT
ELECT REM PT RETURN 9FT ADLT (ELECTROSURGICAL) ×2
ELECTRODE REM PT RTRN 9FT ADLT (ELECTROSURGICAL) ×1 IMPLANT
GLOVE BIOGEL PI IND STRL 6 (GLOVE) ×1 IMPLANT
GLOVE BIOGEL PI INDICATOR 6 (GLOVE) ×1
GLOVE BIOGEL PI MICRO 5.5 (GLOVE) ×1
GLOVE BIOGEL PI MICRO STRL 5.5 (GLOVE) ×1 IMPLANT
GOWN STRL REUS W/ TWL LRG LVL3 (GOWN DISPOSABLE) ×3 IMPLANT
GOWN STRL REUS W/TWL LRG LVL3 (GOWN DISPOSABLE) ×6
KIT BASIN OR (CUSTOM PROCEDURE TRAY) ×2 IMPLANT
KIT TURNOVER KIT B (KITS) ×2 IMPLANT
L-HOOK LAP DISP 36CM (ELECTROSURGICAL) ×2
LHOOK LAP DISP 36CM (ELECTROSURGICAL) ×1 IMPLANT
NDL INSUFFLATION 14GA 120MM (NEEDLE) IMPLANT
NEEDLE INSUFFLATION 14GA 120MM (NEEDLE) IMPLANT
NS IRRIG 1000ML POUR BTL (IV SOLUTION) ×2 IMPLANT
PAD ARMBOARD 7.5X6 YLW CONV (MISCELLANEOUS) ×2 IMPLANT
PENCIL BUTTON HOLSTER BLD 10FT (ELECTRODE) ×2 IMPLANT
POUCH SPECIMEN RETRIEVAL 10MM (ENDOMECHANICALS) ×2 IMPLANT
SCISSORS LAP 5X35 DISP (ENDOMECHANICALS) ×2 IMPLANT
SET IRRIG TUBING LAPAROSCOPIC (IRRIGATION / IRRIGATOR) ×2 IMPLANT
SET TUBE SMOKE EVAC HIGH FLOW (TUBING) ×2 IMPLANT
SLEEVE ENDOPATH XCEL 5M (ENDOMECHANICALS) ×4 IMPLANT
SPECIMEN JAR SMALL (MISCELLANEOUS) ×2 IMPLANT
SUT MNCRL AB 4-0 PS2 18 (SUTURE) ×2 IMPLANT
SUT NOVA NAB DX-16 0-1 5-0 T12 (SUTURE) ×1 IMPLANT
SUT VIC AB 3-0 SH 27 (SUTURE) ×2
SUT VIC AB 3-0 SH 27X BRD (SUTURE) IMPLANT
SUT VIC AB 3-0 SH 27XBRD (SUTURE) IMPLANT
SUT VICRYL 0 UR6 27IN ABS (SUTURE) ×2 IMPLANT
SYR BULB IRRIG 60ML STRL (SYRINGE) ×1 IMPLANT
TOWEL GREEN STERILE (TOWEL DISPOSABLE) ×2 IMPLANT
TOWEL GREEN STERILE FF (TOWEL DISPOSABLE) ×2 IMPLANT
TRAY LAPAROSCOPIC MC (CUSTOM PROCEDURE TRAY) ×2 IMPLANT
TROCAR XCEL 12X100 BLDLESS (ENDOMECHANICALS) IMPLANT
TROCAR XCEL BLUNT TIP 100MML (ENDOMECHANICALS) ×2 IMPLANT
TROCAR XCEL NON-BLD 5MMX100MML (ENDOMECHANICALS) ×2 IMPLANT
WATER STERILE IRR 1000ML POUR (IV SOLUTION) ×2 IMPLANT

## 2020-12-24 NOTE — Op Note (Signed)
Date: 12/24/20  Patient: [name] MRN: [id number]  Preoperative Diagnosis: Biliary pancreatitis, umbilical hernia Postoperative Diagnosis: Same  Procedure: Laparoscopic cholecystectomy, umbilical hernia repair  Surgeon: Sophronia Simas, MD  EBL: Minimal  Anesthesia: General  Specimens: Gallbladder  Indications: Mr. Face is a 60 yo male who had an episode of acute pancreatitis several months ago and was found to have gallstones on further workup. He had no other identifiable etiology for his pancreatitis. Cholecystectomy was recommend to prevent recurrent biliary pancreatitis. After a discussion of the risks and benefits of surgery, he agreed to proceed.  Findings: Cholelithiasis. No evidence of cholecystitis. Small umbilical hernia, primarily repaired.  Procedure details: Informed consent was obtained in the preoperative area prior to the procedure. The patient was brought to the operating room and placed on the table in the supine position. General anesthesia was induced and appropriate lines and drains were placed for intraoperative monitoring. Perioperative antibiotics were administered per SCIP guidelines. The abdomen was prepped and draped in the usual sterile fashion. A pre-procedure timeout was taken verifying patient identity, surgical site and procedure to be performed.  A small supraumbilical skin incision was made, and the subcutaneous tissue was divided with cautery. A hernia sac was encountered at the umbilicus and was circumferentially dissected out. The overlying skin was taken off the sac with cautery. The sac was opened and contained preperitoneal fat. The fascial defect was about 1.5cm. A 72mm Hassan trocar was inserted through the hernia defect. The peritoneal cavity was inspected with no evidence of visceral or vascular injury. Three 55mm ports were placed in the right subcostal margin, all under direct visualization. The fundus of the gallbladder was grasped and  retracted cephalad. The infundibulum was retracted laterally. The cystic triangle was dissected out using cautery and blunt dissection, and the critical view of safety was obtained. The cystic duct and cystic artery were clipped and ligated. The gallbladder was taken off the liver using cautery. The specimen was placed in an endocatch bag and removed. There were numerous stones within the gallbladder. The surgical site was irrigated with saline until the effluent was clear. Hemostasis was achieved in the gallbladder fossa using cautery. The cystic duct and artery stumps were visually inspected and there was no evidence of bile leak or bleeding. The ports were removed under direct visualization and the abdomen was desufflated. The umbilical hernia defect was closed with interrupted 1 Novafil sutures. The overlying skin was tacked to the fascia using 3-0 Vicryl. The skin at all port sites was closed with 4-0 monocryl subcuticular suture. Dermabond was applied.  The patient tolerated the procedure well with no apparent complications. All counts were correct x2 at the end of the procedure. The patient was extubated and taken to PACU in stable condition.  Sophronia Simas, MD 12/24/20 8:45 AM

## 2020-12-24 NOTE — Anesthesia Postprocedure Evaluation (Signed)
Anesthesia Post Note  Patient: [name]  Procedure(s) Performed: LAPAROSCOPIC CHOLECYSTECTOMY (N/A Abdomen) HERNIA REPAIR UMBILICAL ADULT (N/A Abdomen)     Patient location during evaluation: PACU Anesthesia Type: General Level of consciousness: awake and alert Pain management: pain level controlled Vital Signs Assessment: post-procedure vital signs reviewed and stable Respiratory status: spontaneous breathing, nonlabored ventilation and respiratory function stable Cardiovascular status: blood pressure returned to baseline and stable Postop Assessment: no apparent nausea or vomiting Anesthetic complications: no   No complications documented.  Last Vitals:  Vitals:   12/24/20 0940 12/24/20 0945  BP:  (!) 111/53  Pulse: 74 77  Resp: 15 15  Temp:    SpO2: 95% 94%    Last Pain:  Vitals:   12/24/20 0945  TempSrc:   PainSc: 4                  Latiya Navia,W. EDMOND

## 2020-12-24 NOTE — Discharge Instructions (Signed)
General Anesthesia, Adult, Care After This sheet gives you information about how to care for yourself after your procedure. Your health care provider may also give you more specific instructions. If you have problems or questions, contact your health care provider. What can I expect after the procedure? After the procedure, the following side effects are common:  Pain or discomfort at the IV site.  Nausea.  Vomiting.  Sore throat.  Trouble concentrating.  Feeling cold or chills.  Feeling weak or tired.  Sleepiness and fatigue.  Soreness and body aches. These side effects can affect parts of the body that were not involved in surgery. Follow these instructions at home: For the time period you were told by your health care provider:  Rest.  Do not participate in activities where you could fall or become injured.  Do not drive or use machinery.  Do not drink alcohol.  Do not take sleeping pills or medicines that cause drowsiness.  Do not make important decisions or sign legal documents.  Do not take care of children on your own.   Eating and drinking  Follow any instructions from your health care provider about eating or drinking restrictions.  When you feel hungry, start by eating small amounts of foods that are soft and easy to digest (bland), such as toast. Gradually return to your regular diet.  Drink enough fluid to keep your urine pale yellow.  If you vomit, rehydrate by drinking water, juice, or clear broth. General instructions  If you have sleep apnea, surgery and certain medicines can increase your risk for breathing problems. Follow instructions from your health care provider about wearing your sleep device: ? Anytime you are sleeping, including during daytime naps. ? While taking prescription pain medicines, sleeping medicines, or medicines that make you drowsy.  Have a responsible adult stay with you for the time you are told. It is important to have  someone help care for you until you are awake and alert.  Return to your normal activities as told by your health care provider. Ask your health care provider what activities are safe for you.  Take over-the-counter and prescription medicines only as told by your health care provider.  If you smoke, do not smoke without supervision.  Keep all follow-up visits as told by your health care provider. This is important. Contact a health care provider if:  You have nausea or vomiting that does not get better with medicine.  You cannot eat or drink without vomiting.  You have pain that does not get better with medicine.  You are unable to pass urine.  You develop a skin rash.  You have a fever.  You have redness around your IV site that gets worse. Get help right away if:  You have difficulty breathing.  You have chest pain.  You have blood in your urine or stool, or you vomit blood. Summary  After the procedure, it is common to have a sore throat or nausea. It is also common to feel tired.  Have a responsible adult stay with you for the time you are told. It is important to have someone help care for you until you are awake and alert.  When you feel hungry, start by eating small amounts of foods that are soft and easy to digest (bland), such as toast. Gradually return to your regular diet.  Drink enough fluid to keep your urine pale yellow.  Return to your normal activities as told by your health care provider.   Ask your health care provider what activities are safe for you. This information is not intended to replace advice given to you by your health care provider. Make sure you discuss any questions you have with your health care provider. Document Revised: 06/21/2020 Document Reviewed: 01/19/2020 Elsevier Patient Education  2021 [employer] [facility] DISCHARGE INSTRUCTIONS  Activity . No heavy lifting greater than 10 pounds for 4 weeks after  surgery. Marland Kitchen Ok to shower in 48 hours after surgery, but do not bathe or submerge incisions underwater. . Do not drive while taking narcotic pain medication.  Wound Care . Your incisions are covered with skin glue called Dermabond. This will peel off on its own over time. . You may shower and allow warm soapy water to run over your incisions. Gently pat dry. . Do not submerge your incision underwater. . Monitor your incision for any new redness, tenderness, or drainage.  When to Call us: Marland Kitchen Fever greater than 100.5 . New redness, drainage, or swelling at incision site . Severe pain, nausea, or vomiting . Jaundice (yellowing of the whites of the eyes or skin)  Follow-up You have an appointment scheduled with Dr. Freida Busman on January 08, 2021 at 9:45am. This will be at the [facility] office at [address], [address], [place], [place]. Please arrive at least 15 minutes prior to your scheduled appointment time.  For questions or concerns, please call the office at [phone].

## 2020-12-24 NOTE — Progress Notes (Signed)
Pacu Discharge Note  Patient instuctions were given to wife Jasmine December. Wound care, diet, pain, follow up care and how and whom to contact with concerns were discussed. Family aware someone needs to remain with patient overnight and concerns after receiving anesthesia and what to avoid and safety. Answered all questions and concerns.   Discharge paperwork has clear contact informations for surgeon and 24 hour RN line for concerns.   Discussed what concerns to look for including infection and signs/symptoms to look for.   IV was removed prior to discharge. Patient was brought to car with belongings.   Pt exits my care.

## 2020-12-24 NOTE — H&P (Signed)
[name] is an 60 y.o. male.   Chief Complaint: gallstones  HPI: [name] is a 60 yo male who had an episode of acute pancreatitis a few months ago. Follow up EGD/EUS showed gallstones. He does not consume much alcohol and no other etiology of the pancreatitis was identified. He does have some symptoms of biliary colic. He presents today for cholecystectomy. No recent fevers or chills. He still has occasional upper abdominal pain after eating.  Past Medical History:  Diagnosis Date  . Allergic rhinitis   . Anxiety   . Back pain with radiation 08-04-2014  . Depression   . Diabetes mellitus without complication (HCC)    takes metformen  . DJD (degenerative joint disease)   . Gastroesophageal reflux disease   . History of hiatal hernia   . Hyperlipidemia   . Hypermetropia   . Hypertension   . Lumbar radiculopathy   . Lumbar radiculopathy 03-12-2015  . Lumbosacral neuritis 05-02-2013  . Osteoarthritis    DDD- lumbar  . Presbyopia     Past Surgical History:  Procedure Laterality Date  . ABDOMINAL EXPOSURE N/A 08/01/2015   Procedure: ABDOMINAL EXPOSURE;  Surgeon: Larina Earthly, MD;  Location: [facility] OR;  Service: Vascular;  Laterality: N/A;  . ANTERIOR LUMBAR FUSION  08/01/2015   LEVEL 2  . ANTERIOR LUMBAR FUSION N/A 08/01/2015   Procedure: ANTERIOR LUMBAR FUSION 2 LEVELS;  Surgeon: Estill Bamberg, MD;  Location: [facility] OR;  Service: Orthopedics;  Laterality: N/A;  Lumbar 4-5, lumbar 5-sacrum 1 Anterior lumbar interbody fusion  . BACK SURGERY  [id number]  . epidural injections    . ESOPHAGOGASTRODUODENOSCOPY (EGD) WITH PROPOFOL N/A 08/31/2020   Procedure: ESOPHAGOGASTRODUODENOSCOPY (EGD) WITH PROPOFOL;  Surgeon: Jeani Hawking, MD;  Location: [facility] ENDOSCOPY;  Service: Endoscopy;  Laterality: N/A;  . HEMORROIDECTOMY  11/12/2011  . LAMINECTOMY  08/25/2006  . LUMBAR LAMINECTOMY    . UPPER ESOPHAGEAL ENDOSCOPIC ULTRASOUND (EUS) N/A 08/31/2020   Procedure: UPPER ESOPHAGEAL ENDOSCOPIC  ULTRASOUND (EUS);  Surgeon: Jeani Hawking, MD;  Location: [facility];  Service: Endoscopy;  Laterality: N/A;    Family History  Problem Relation Age of Onset  . Hypertension Mother   . Hypertension Father    Social History:  reports that he quit smoking about 11 years ago. His smoking use included cigarettes. He uses smokeless tobacco. He reports current alcohol use of about 2.0 - 3.0 standard drinks of alcohol per week. He reports that he does not use drugs.  Allergies:  Allergies  Allergen Reactions  . Gabapentin Swelling  . Lisinopril Swelling  . Olmesartan-Amlodipine-Hctz Hives    Medications Prior to Admission  Medication Sig Dispense Refill  . acetaminophen (TYLENOL) 500 MG tablet Take 1,500 mg by mouth daily as needed for moderate pain or mild pain.    Marland Kitchen amLODipine (NORVASC) 10 MG tablet Take 10 mg by mouth daily.    Marland Kitchen atorvastatin (LIPITOR) 10 MG tablet Take 10 mg by mouth at bedtime.    . Cholecalciferol (VITAMIN D3) 1.25 MG (50000 UT) CAPS Take 50,000 mcg by mouth once a week.    . clotrimazole-betamethasone (LOTRISONE) cream Apply 1 application topically daily as needed (rash).    Marland Kitchen dexlansoprazole (DEXILANT) 60 MG capsule Take 60 mg by mouth daily before breakfast.     . DULoxetine (CYMBALTA) 60 MG capsule Take 60 mg by mouth 2 (two) times daily.    Marland Kitchen losartan-hydrochlorothiazide (HYZAAR) 100-12.5 MG tablet Take 1 tablet by mouth daily.    Marland Kitchen MAGNESIUM OXIDE  PO Take 500 mg by mouth daily.    . metFORMIN (GLUCOPHAGE) 1000 MG tablet Take 1,000 mg by mouth 2 (two) times daily.    . metoprolol succinate (TOPROL-XL) 50 MG 24 hr tablet Take 50 mg by mouth daily. Take with or immediately following a meal.    . pregabalin (LYRICA) 75 MG capsule Take 75-150 mg by mouth See admin instructions. Take 150 mg in the morning and 75 mg at bedtime    . spironolactone (ALDACTONE) 25 MG tablet Take 25 mg by mouth daily.    . tamsulosin (FLOMAX) 0.4 MG CAPS capsule Take 1 capsule by mouth  every evening.    . temazepam (RESTORIL) 15 MG capsule Take 15 mg by mouth at bedtime as needed for sleep.     . ziprasidone (GEODON) 20 MG capsule Take 20 mg by mouth at bedtime.      Results for orders placed or performed during the hospital encounter of 12/24/20 (from the past 48 hour(s))  Glucose, capillary     Status: Abnormal   Collection Time: 12/24/20  6:29 AM  Result Value Ref Range   Glucose-Capillary 113 (H) 70 - 99 mg/dL    Comment: Glucose reference range applies only to samples taken after fasting for at least 8 hours.   Comment 1 Notify RN   Basic metabolic panel per protocol     Status: Abnormal   Collection Time: 12/24/20  6:57 AM  Result Value Ref Range   Sodium 135 135 - 145 mmol/L   Potassium 3.6 3.5 - 5.1 mmol/L   Chloride 101 98 - 111 mmol/L   CO2 22 22 - 32 mmol/L   Glucose, Bld 110 (H) 70 - 99 mg/dL    Comment: Glucose reference range applies only to samples taken after fasting for at least 8 hours.   BUN 14 6 - 20 mg/dL   Creatinine, Ser 3.82 0.61 - 1.24 mg/dL   Calcium 8.8 (L) 8.9 - 10.3 mg/dL   GFR, Estimated >50 >53 mL/min    Comment: (NOTE) Calculated using the CKD-EPI Creatinine Equation (2021)    Anion gap 12 5 - 15    Comment: Performed at [facility], [address] [place], [place] [address]   No results found.  Review of Systems  Constitutional: Negative for fatigue and fever.  Gastrointestinal: Positive for abdominal pain.    Blood pressure 103/70, pulse 62, temperature (!) 97.3 F (36.3 C), temperature source Oral, resp. rate 18, height 5\' 10"  (1.778 m), weight 116.6 kg, SpO2 100 %. Physical Exam Constitutional:      Appearance: Normal appearance.  HENT:     Head: Normocephalic and atraumatic.  Eyes:     General: No scleral icterus.    Conjunctiva/sclera: Conjunctivae normal.  Pulmonary:     Effort: Pulmonary effort is normal. No respiratory distress.  Abdominal:     General: There is no distension.     Palpations:  Abdomen is soft.     Tenderness: There is no abdominal tenderness.     Comments: Umbilical hernia  Musculoskeletal:        General: Normal range of motion.     Cervical back: Normal range of motion.  Skin:    General: Skin is warm and dry.  Neurological:     General: No focal deficit present.     Mental Status: He is alert and oriented to person, place, and time.  Psychiatric:        Mood and Affect: Mood  normal.        Behavior: Behavior normal.        Thought Content: Thought content normal.      Assessment/Plan 60 yo male with biliary pancreatitis, biliary colic. Proceed to OR for lap cholecystectomy. Informed consent obtained. Plan for discharge home from PACU.  Fritzi Mandes, MD 12/24/2020, 7:27 AM

## 2020-12-24 NOTE — Transfer of Care (Signed)
Immediate Anesthesia Transfer of Care Note  Patient: [name]  Procedure(s) Performed: LAPAROSCOPIC CHOLECYSTECTOMY (N/A Abdomen) HERNIA REPAIR UMBILICAL ADULT (N/A Abdomen)  Patient Location: PACU  Anesthesia Type:General  Level of Consciousness: alert , oriented, drowsy and patient cooperative  Airway & Oxygen Therapy: Patient Spontanous Breathing and Patient connected to face mask oxygen  Post-op Assessment: Report given to RN, Post -op Vital signs reviewed and stable and Patient moving all extremities X 4  Post vital signs: Reviewed and stable  Last Vitals:  Vitals Value Taken Time  BP    Temp    Pulse 76 12/24/20 0846  Resp 15 12/24/20 0846  SpO2 99 % 12/24/20 0846  Vitals shown include unvalidated device data.  Last Pain:  Vitals:   12/24/20 0647  TempSrc:   PainSc: 4       Patients Stated Pain Goal: 2 (12/24/20 1683)  Complications: No complications documented.

## 2020-12-24 NOTE — Anesthesia Procedure Notes (Signed)
Procedure Name: Intubation Date/Time: 12/24/2020 7:44 AM Performed by: Hendricks Limes, CRNA Pre-anesthesia Checklist: Patient identified, Emergency Drugs available, Suction available and Patient being monitored Patient Re-evaluated:Patient Re-evaluated prior to induction Oxygen Delivery Method: Circle system utilized Preoxygenation: Pre-oxygenation with 100% oxygen Induction Type: IV induction Ventilation: Mask ventilation without difficulty and Oral airway inserted - appropriate to patient size Laryngoscope Size: Mac and 4 Grade View: Grade I Tube type: Oral Tube size: 7.5 mm Number of attempts: 1 Airway Equipment and Method: Stylet and Oral airway Placement Confirmation: ETT inserted through vocal cords under direct vision,  positive ETCO2 and breath sounds checked- equal and bilateral Secured at: 23 cm Tube secured with: Tape Dental Injury: Teeth and Oropharynx as per pre-operative assessment

## 2020-12-25 ENCOUNTER — Encounter (HOSPITAL_COMMUNITY): Payer: Self-pay | Admitting: Surgery

## 2020-12-25 LAB — SURGICAL PATHOLOGY
